# Patient Record
Sex: Female | Born: 1950 | Race: White | Hispanic: No | Marital: Married | Smoking: Never smoker
Health system: Southern US, Community
[De-identification: ages and names within clinical notes are randomized; demographics above are authoritative.]

## PROBLEM LIST (undated history)

## (undated) DIAGNOSIS — I10 Essential (primary) hypertension: Secondary | ICD-10-CM

## (undated) DIAGNOSIS — F32A Depression, unspecified: Secondary | ICD-10-CM

## (undated) DIAGNOSIS — F329 Major depressive disorder, single episode, unspecified: Secondary | ICD-10-CM

## (undated) DIAGNOSIS — E039 Hypothyroidism, unspecified: Secondary | ICD-10-CM

## (undated) DIAGNOSIS — F4310 Post-traumatic stress disorder, unspecified: Secondary | ICD-10-CM

## (undated) DIAGNOSIS — M1990 Unspecified osteoarthritis, unspecified site: Secondary | ICD-10-CM

## (undated) DIAGNOSIS — T4145XA Adverse effect of unspecified anesthetic, initial encounter: Secondary | ICD-10-CM

## (undated) DIAGNOSIS — E079 Disorder of thyroid, unspecified: Secondary | ICD-10-CM

## (undated) DIAGNOSIS — C189 Malignant neoplasm of colon, unspecified: Secondary | ICD-10-CM

## (undated) DIAGNOSIS — H9192 Unspecified hearing loss, left ear: Secondary | ICD-10-CM

## (undated) DIAGNOSIS — D649 Anemia, unspecified: Secondary | ICD-10-CM

## (undated) DIAGNOSIS — T8859XA Other complications of anesthesia, initial encounter: Secondary | ICD-10-CM

## (undated) DIAGNOSIS — Z9889 Other specified postprocedural states: Secondary | ICD-10-CM

## (undated) DIAGNOSIS — E7800 Pure hypercholesterolemia, unspecified: Secondary | ICD-10-CM

## (undated) DIAGNOSIS — L719 Rosacea, unspecified: Secondary | ICD-10-CM

## (undated) DIAGNOSIS — R112 Nausea with vomiting, unspecified: Secondary | ICD-10-CM

## (undated) DIAGNOSIS — J45909 Unspecified asthma, uncomplicated: Secondary | ICD-10-CM

## (undated) DIAGNOSIS — J302 Other seasonal allergic rhinitis: Secondary | ICD-10-CM

## (undated) HISTORY — PX: DILATION AND CURETTAGE OF UTERUS: SHX78

## (undated) HISTORY — PX: COLONOSCOPY: SHX174

## (undated) HISTORY — PX: FRACTURE SURGERY: SHX138

---

## 1973-11-10 HISTORY — PX: APPENDECTOMY: SHX54

## 1976-11-10 HISTORY — PX: TUBAL LIGATION: SHX77

## 2008-11-10 HISTORY — PX: BRAIN SURGERY: SHX531

## 2013-07-28 ENCOUNTER — Inpatient Hospital Stay (HOSPITAL_COMMUNITY)
Admission: EM | Admit: 2013-07-28 | Discharge: 2013-07-30 | DRG: 219 | Disposition: A | Discharge status: Home / Self Care | Payer: Federal, State, Local not specified - PPO | Attending: Orthopedic Surgery | Admitting: Orthopedic Surgery

## 2013-07-28 DIAGNOSIS — S82843B Displaced bimalleolar fracture of unspecified lower leg, initial encounter for open fracture type I or II: Principal | ICD-10-CM | POA: Diagnosis present

## 2013-07-28 DIAGNOSIS — Y92009 Unspecified place in unspecified non-institutional (private) residence as the place of occurrence of the external cause: Secondary | ICD-10-CM

## 2013-07-28 DIAGNOSIS — F329 Major depressive disorder, single episode, unspecified: Secondary | ICD-10-CM | POA: Diagnosis present

## 2013-07-28 DIAGNOSIS — I10 Essential (primary) hypertension: Secondary | ICD-10-CM | POA: Diagnosis present

## 2013-07-28 DIAGNOSIS — F3289 Other specified depressive episodes: Secondary | ICD-10-CM | POA: Diagnosis present

## 2013-07-28 DIAGNOSIS — Z79899 Other long term (current) drug therapy: Secondary | ICD-10-CM

## 2013-07-28 DIAGNOSIS — Z7982 Long term (current) use of aspirin: Secondary | ICD-10-CM

## 2013-07-28 DIAGNOSIS — W19XXXA Unspecified fall, initial encounter: Secondary | ICD-10-CM | POA: Diagnosis present

## 2013-07-28 DIAGNOSIS — S82899B Other fracture of unspecified lower leg, initial encounter for open fracture type I or II: Secondary | ICD-10-CM

## 2013-07-28 DIAGNOSIS — F4310 Post-traumatic stress disorder, unspecified: Secondary | ICD-10-CM | POA: Diagnosis present

## 2013-07-28 HISTORY — DX: Essential (primary) hypertension: I10

## 2013-07-28 HISTORY — DX: Major depressive disorder, single episode, unspecified: F32.9

## 2013-07-28 HISTORY — DX: Depression, unspecified: F32.A

## 2013-07-28 HISTORY — DX: Post-traumatic stress disorder, unspecified: F43.10

## 2013-07-28 HISTORY — DX: Disorder of thyroid, unspecified: E07.9

## 2013-07-28 HISTORY — DX: Unspecified osteoarthritis, unspecified site: M19.90

## 2013-07-29 ENCOUNTER — Encounter (HOSPITAL_COMMUNITY)
Admission: EM | Disposition: A | Discharge status: Home / Self Care | Payer: Self-pay | Source: Home / Self Care | Attending: Orthopedic Surgery

## 2013-07-29 ENCOUNTER — Encounter (HOSPITAL_COMMUNITY): Payer: Self-pay | Admitting: Certified Registered Nurse Anesthetist

## 2013-07-29 ENCOUNTER — Encounter (HOSPITAL_COMMUNITY): Payer: Self-pay

## 2013-07-29 ENCOUNTER — Emergency Department (HOSPITAL_COMMUNITY): Payer: Federal, State, Local not specified - PPO

## 2013-07-29 ENCOUNTER — Emergency Department (HOSPITAL_COMMUNITY): Payer: Federal, State, Local not specified - PPO | Admitting: Certified Registered Nurse Anesthetist

## 2013-07-29 HISTORY — PX: ORIF ANKLE FRACTURE: SHX5408

## 2013-07-29 HISTORY — PX: I & D EXTREMITY: SHX5045

## 2013-07-29 LAB — POCT I-STAT, CHEM 8
BUN: 16 mg/dL (ref 6–23)
Creatinine, Ser: 1.3 mg/dL — ABNORMAL HIGH (ref 0.50–1.10)
Glucose, Bld: 119 mg/dL — ABNORMAL HIGH (ref 70–99)
Hemoglobin: 13.9 g/dL (ref 12.0–15.0)
Sodium: 131 mEq/L — ABNORMAL LOW (ref 135–145)
TCO2: 23 mmol/L (ref 0–100)

## 2013-07-29 LAB — CBC
HCT: 37.2 % (ref 36.0–46.0)
HCT: 35.6 % — ABNORMAL LOW (ref 36.0–46.0)
Hemoglobin: 13.5 g/dL (ref 12.0–15.0)
MCH: 35.1 pg — ABNORMAL HIGH (ref 26.0–34.0)
MCHC: 36.3 g/dL — ABNORMAL HIGH (ref 30.0–36.0)
MCHC: 35.1 g/dL (ref 30.0–36.0)
MCV: 96.6 fL (ref 78.0–100.0)
MCV: 98.3 fL (ref 78.0–100.0)
RDW: 13.6 % (ref 11.5–15.5)
RDW: 13.8 % (ref 11.5–15.5)
WBC: 7.3 10*3/uL (ref 4.0–10.5)

## 2013-07-29 SURGERY — OPEN REDUCTION INTERNAL FIXATION (ORIF) ANKLE FRACTURE
Anesthesia: General | Site: Ankle | Laterality: Left | Wound class: Contaminated

## 2013-07-29 MED ORDER — LACTATED RINGERS IV SOLN
INTRAVENOUS | Status: DC | PRN
  Administered 2013-07-29: 03:00:00 via INTRAVENOUS

## 2013-07-29 MED ORDER — MORPHINE SULFATE 2 MG/ML IJ SOLN: 1.0000 mg | INTRAMUSCULAR | Status: DC | PRN

## 2013-07-29 MED ORDER — FLEET ENEMA 7-19 GM/118ML RE ENEM: 1.0000 | ENEMA | Freq: Once | RECTAL | Status: AC | PRN

## 2013-07-29 MED ORDER — ONDANSETRON HCL 4 MG/2ML IJ SOLN
4.0000 mg | Freq: Once | INTRAMUSCULAR | Status: AC
  Administered 2013-07-29: 4 mg via INTRAVENOUS
  Filled 2013-07-29: qty 2

## 2013-07-29 MED ORDER — DIPHENHYDRAMINE HCL 12.5 MG/5ML PO ELIX: 12.5000 mg | ORAL_SOLUTION | ORAL | Status: DC | PRN

## 2013-07-29 MED ORDER — OXYCODONE-ACETAMINOPHEN 5-325 MG PO TABS: 1.0000 | ORAL_TABLET | ORAL | Status: DC | PRN

## 2013-07-29 MED ORDER — POLYETHYLENE GLYCOL 3350 17 G PO PACK: 17.0000 g | PACK | Freq: Every day | ORAL | Status: DC | PRN

## 2013-07-29 MED ORDER — HYDROMORPHONE HCL PF 1 MG/ML IJ SOLN
1.0000 mg | Freq: Once | INTRAMUSCULAR | Status: AC
  Administered 2013-07-29: 1 mg via INTRAVENOUS
  Filled 2013-07-29: qty 1

## 2013-07-29 MED ORDER — ASPIRIN EC 325 MG PO TBEC
325.0000 mg | DELAYED_RELEASE_TABLET | Freq: Two times a day (BID) | ORAL | Status: DC
  Administered 2013-07-29 – 2013-07-30 (×3): 325 mg via ORAL
  Filled 2013-07-29 (×5): qty 1

## 2013-07-29 MED ORDER — FENTANYL CITRATE 0.05 MG/ML IJ SOLN
50.0000 ug | INTRAMUSCULAR | Status: DC | PRN
  Administered 2013-07-29: 50 ug via INTRAVENOUS
  Filled 2013-07-29: qty 2

## 2013-07-29 MED ORDER — DEXAMETHASONE SODIUM PHOSPHATE 4 MG/ML IJ SOLN
INTRAMUSCULAR | Status: DC | PRN
  Administered 2013-07-29: 4 mg via INTRAVENOUS

## 2013-07-29 MED ORDER — HYDROMORPHONE HCL PF 1 MG/ML IJ SOLN
INTRAMUSCULAR | Status: AC
  Filled 2013-07-29: qty 1

## 2013-07-29 MED ORDER — ARTIFICIAL TEARS OP OINT
TOPICAL_OINTMENT | OPHTHALMIC | Status: DC | PRN
  Administered 2013-07-29: 1 via OPHTHALMIC

## 2013-07-29 MED ORDER — LIDOCAINE HCL (CARDIAC) 20 MG/ML IV SOLN
INTRAVENOUS | Status: DC | PRN
  Administered 2013-07-29: 40 mg via INTRAVENOUS

## 2013-07-29 MED ORDER — PROPOFOL 10 MG/ML IV BOLUS
INTRAVENOUS | Status: DC | PRN
  Administered 2013-07-29: 140 mg via INTRAVENOUS

## 2013-07-29 MED ORDER — OXYCODONE HCL 5 MG PO TABS: 5.0000 mg | ORAL_TABLET | Freq: Once | ORAL | Status: DC | PRN

## 2013-07-29 MED ORDER — METOCLOPRAMIDE HCL 10 MG PO TABS: 5.0000 mg | ORAL_TABLET | Freq: Three times a day (TID) | ORAL | Status: DC | PRN

## 2013-07-29 MED ORDER — DOCUSATE SODIUM 100 MG PO CAPS
100.0000 mg | ORAL_CAPSULE | Freq: Two times a day (BID) | ORAL | Status: DC
  Administered 2013-07-29 – 2013-07-30 (×3): 100 mg via ORAL
  Filled 2013-07-29 (×3): qty 1

## 2013-07-29 MED ORDER — SUCCINYLCHOLINE CHLORIDE 20 MG/ML IJ SOLN
INTRAMUSCULAR | Status: DC | PRN
  Administered 2013-07-29: 100 mg via INTRAVENOUS

## 2013-07-29 MED ORDER — SODIUM CHLORIDE 0.9 % IV SOLN
INTRAVENOUS | Status: DC
  Administered 2013-07-29: 01:00:00 via INTRAVENOUS

## 2013-07-29 MED ORDER — HYDROCODONE-ACETAMINOPHEN 5-325 MG PO TABS: 1.0000 | ORAL_TABLET | ORAL | Status: DC | PRN

## 2013-07-29 MED ORDER — OXYCODONE HCL 5 MG/5ML PO SOLN: 5.0000 mg | Freq: Once | ORAL | Status: DC | PRN

## 2013-07-29 MED ORDER — FERROUS SULFATE 325 (65 FE) MG PO TABS
325.0000 mg | ORAL_TABLET | Freq: Three times a day (TID) | ORAL | Status: DC
  Administered 2013-07-29 – 2013-07-30 (×4): 325 mg via ORAL
  Filled 2013-07-29 (×7): qty 1

## 2013-07-29 MED ORDER — EPHEDRINE SULFATE 50 MG/ML IJ SOLN
INTRAMUSCULAR | Status: DC | PRN
  Administered 2013-07-29 (×2): 5 mg via INTRAVENOUS
  Administered 2013-07-29 (×3): 10 mg via INTRAVENOUS

## 2013-07-29 MED ORDER — ONDANSETRON HCL 4 MG PO TABS: 4.0000 mg | ORAL_TABLET | Freq: Four times a day (QID) | ORAL | Status: DC | PRN

## 2013-07-29 MED ORDER — MIDAZOLAM HCL 5 MG/5ML IJ SOLN
INTRAMUSCULAR | Status: DC | PRN
  Administered 2013-07-29: 2 mg via INTRAVENOUS

## 2013-07-29 MED ORDER — OXYCODONE-ACETAMINOPHEN 5-325 MG PO TABS
1.0000 | ORAL_TABLET | ORAL | Status: DC | PRN
  Administered 2013-07-29 (×3): 2 via ORAL
  Filled 2013-07-29 (×3): qty 2

## 2013-07-29 MED ORDER — CLINDAMYCIN PHOSPHATE 600 MG/50ML IV SOLN
600.0000 mg | Freq: Four times a day (QID) | INTRAVENOUS | Status: DC
  Administered 2013-07-29 – 2013-07-30 (×6): 600 mg via INTRAVENOUS
  Filled 2013-07-29 (×8): qty 50

## 2013-07-29 MED ORDER — FENTANYL CITRATE 0.05 MG/ML IJ SOLN
INTRAMUSCULAR | Status: DC | PRN
  Administered 2013-07-29: 100 ug via INTRAVENOUS
  Administered 2013-07-29 (×3): 50 ug via INTRAVENOUS

## 2013-07-29 MED ORDER — ZOLPIDEM TARTRATE 5 MG PO TABS: 5.0000 mg | ORAL_TABLET | Freq: Every evening | ORAL | Status: DC | PRN

## 2013-07-29 MED ORDER — METOCLOPRAMIDE HCL 5 MG/ML IJ SOLN: 5.0000 mg | Freq: Three times a day (TID) | INTRAMUSCULAR | Status: DC | PRN

## 2013-07-29 MED ORDER — OXYCODONE HCL 5 MG PO TABS
5.0000 mg | ORAL_TABLET | ORAL | Status: DC | PRN
  Administered 2013-07-30 (×2): 10 mg via ORAL
  Filled 2013-07-29 (×2): qty 2

## 2013-07-29 MED ORDER — VANCOMYCIN HCL IN DEXTROSE 1-5 GM/200ML-% IV SOLN
1000.0000 mg | Freq: Once | INTRAVENOUS | Status: AC
  Administered 2013-07-29: 1000 mg via INTRAVENOUS
  Filled 2013-07-29: qty 200

## 2013-07-29 MED ORDER — PHENYLEPHRINE HCL 10 MG/ML IJ SOLN
INTRAMUSCULAR | Status: DC | PRN
  Administered 2013-07-29 (×3): 40 ug via INTRAVENOUS

## 2013-07-29 MED ORDER — INFLUENZA VAC SPLIT QUAD 0.5 ML IM SUSP
0.5000 mL | INTRAMUSCULAR | Status: AC
  Administered 2013-07-30: 0.5 mL via INTRAMUSCULAR
  Filled 2013-07-29: qty 0.5

## 2013-07-29 MED ORDER — HYDROMORPHONE HCL PF 1 MG/ML IJ SOLN
0.2500 mg | INTRAMUSCULAR | Status: DC | PRN
  Administered 2013-07-29 (×2): 0.5 mg via INTRAVENOUS

## 2013-07-29 MED ORDER — TETANUS-DIPHTH-ACELL PERTUSSIS 5-2.5-18.5 LF-MCG/0.5 IM SUSP
0.5000 mL | Freq: Once | INTRAMUSCULAR | Status: DC
  Filled 2013-07-29: qty 0.5

## 2013-07-29 MED ORDER — SODIUM CHLORIDE 0.9 % IV SOLN: INTRAVENOUS | Status: DC

## 2013-07-29 MED ORDER — ONDANSETRON HCL 4 MG/2ML IJ SOLN
4.0000 mg | Freq: Four times a day (QID) | INTRAMUSCULAR | Status: DC | PRN
  Administered 2013-07-29: 4 mg via INTRAVENOUS
  Filled 2013-07-29: qty 2

## 2013-07-29 MED ORDER — BISACODYL 10 MG RE SUPP: 10.0000 mg | Freq: Every day | RECTAL | Status: DC | PRN

## 2013-07-29 SURGICAL SUPPLY — 83 items
BANDAGE ELASTIC 4 VELCRO ST LF (GAUZE/BANDAGES/DRESSINGS) ×2 IMPLANT
BANDAGE ELASTIC 6 VELCRO ST LF (GAUZE/BANDAGES/DRESSINGS) ×2 IMPLANT
BANDAGE ESMARK 6X9 LF (GAUZE/BANDAGES/DRESSINGS) ×1 IMPLANT
BANDAGE GAUZE ELAST BULKY 4 IN (GAUZE/BANDAGES/DRESSINGS) ×2 IMPLANT
BIT DRILL 2.5X110 QC LCP DISP (BIT) ×2 IMPLANT
BIT DRILL CANN 2.7X625 NONSTRL (BIT) ×2 IMPLANT
BLADE SURG 10 STRL SS (BLADE) ×2 IMPLANT
BLADE SURG ROTATE 9660 (MISCELLANEOUS) IMPLANT
BNDG COHESIVE 4X5 TAN STRL (GAUZE/BANDAGES/DRESSINGS) ×2 IMPLANT
BNDG ELASTIC 6X15 VLCR STRL LF (GAUZE/BANDAGES/DRESSINGS) ×2 IMPLANT
BNDG ESMARK 6X9 LF (GAUZE/BANDAGES/DRESSINGS) ×2
CHLORAPREP W/TINT 26ML (MISCELLANEOUS) ×2 IMPLANT
CLOTH BEACON ORANGE TIMEOUT ST (SAFETY) ×2 IMPLANT
COUNTERSINK F/3.5/4 CANN SCRW (INSTRUMENTS) ×4
COVER MAYO STAND STRL (DRAPES) ×2 IMPLANT
COVER SURGICAL LIGHT HANDLE (MISCELLANEOUS) ×2 IMPLANT
CUFF TOURNIQUET SINGLE 34IN LL (TOURNIQUET CUFF) ×2 IMPLANT
CUFF TOURNIQUET SINGLE 44IN (TOURNIQUET CUFF) IMPLANT
DRAPE OEC MINIVIEW 54X84 (DRAPES) IMPLANT
DRAPE SURG 17X23 STRL (DRAPES) ×2 IMPLANT
DRAPE U-SHAPE 47X51 STRL (DRAPES) ×2 IMPLANT
DRSG ADAPTIC 3X8 NADH LF (GAUZE/BANDAGES/DRESSINGS) ×2 IMPLANT
DRSG PAD ABDOMINAL 8X10 ST (GAUZE/BANDAGES/DRESSINGS) ×4 IMPLANT
DURAPREP 26ML APPLICATOR (WOUND CARE) ×2 IMPLANT
ELECT CAUTERY BLADE 6.4 (BLADE) ×2 IMPLANT
ELECT REM PT RETURN 9FT ADLT (ELECTROSURGICAL) ×2
ELECTRODE REM PT RTRN 9FT ADLT (ELECTROSURGICAL) ×1 IMPLANT
GAUZE XEROFORM 1X8 LF (GAUZE/BANDAGES/DRESSINGS) ×4 IMPLANT
GLOVE BIO SURGEON STRL SZ7 (GLOVE) ×2 IMPLANT
GLOVE BIO SURGEON STRL SZ7.5 (GLOVE) ×10 IMPLANT
GLOVE BIOGEL PI IND STRL 7.5 (GLOVE) ×1 IMPLANT
GLOVE BIOGEL PI IND STRL 8 (GLOVE) ×1 IMPLANT
GLOVE BIOGEL PI INDICATOR 7.5 (GLOVE) ×1
GLOVE BIOGEL PI INDICATOR 8 (GLOVE) ×1
GLOVE SURG SS PI 7.5 STRL IVOR (GLOVE) ×2 IMPLANT
GOWN PREVENTION PLUS LG XLONG (DISPOSABLE) ×4 IMPLANT
GOWN PREVENTION PLUS XLARGE (GOWN DISPOSABLE) ×2 IMPLANT
GOWN STRL NON-REIN LRG LVL3 (GOWN DISPOSABLE) ×6 IMPLANT
HANDPIECE INTERPULSE COAX TIP (DISPOSABLE)
IMMOBILIZER KNEE 24 THIGH 36 (MISCELLANEOUS) IMPLANT
IMMOBILIZER KNEE 24 UNIV (MISCELLANEOUS)
KIT BASIN OR (CUSTOM PROCEDURE TRAY) ×2 IMPLANT
KIT ROOM TURNOVER OR (KITS) ×2 IMPLANT
MANIFOLD NEPTUNE II (INSTRUMENTS) IMPLANT
NEEDLE 22X1 1/2 (OR ONLY) (NEEDLE) IMPLANT
NS IRRIG 1000ML POUR BTL (IV SOLUTION) ×2 IMPLANT
PACK ORTHO EXTREMITY (CUSTOM PROCEDURE TRAY) ×2 IMPLANT
PAD ARMBOARD 7.5X6 YLW CONV (MISCELLANEOUS) ×4 IMPLANT
PAD CAST 4YDX4 CTTN HI CHSV (CAST SUPPLIES) ×1 IMPLANT
PADDING CAST COTTON 4X4 STRL (CAST SUPPLIES) ×1
PLATE LCP 3.5 1/3 TUB 6HX69 (Plate) ×2 IMPLANT
SCREW CANC FT 4.0X20 (Screw) ×2 IMPLANT
SCREW CNTRSNK F/3.5/4 CAN SCRW (INSTRUMENTS) ×2 IMPLANT
SCREW CORTEX 3.5 12MM (Screw) ×1 IMPLANT
SCREW CORTEX 3.5 14MM (Screw) ×2 IMPLANT
SCREW CORTEX 3.5 20MM (Screw) ×1 IMPLANT
SCREW CORTEX 3.5 22MM (Screw) ×1 IMPLANT
SCREW LOCK CORT ST 3.5X12 (Screw) ×1 IMPLANT
SCREW LOCK CORT ST 3.5X14 (Screw) ×2 IMPLANT
SCREW LOCK CORT ST 3.5X20 (Screw) ×1 IMPLANT
SCREW LOCK CORT ST 3.5X22 (Screw) ×1 IMPLANT
SCREW SHORT THREAD 4.0X40 (Screw) ×4 IMPLANT
SET HNDPC FAN SPRY TIP SCT (DISPOSABLE) IMPLANT
SPLINT PLASTER CAST XFAST 4X15 (CAST SUPPLIES) ×1 IMPLANT
SPLINT PLASTER XTRA FAST SET 4 (CAST SUPPLIES) ×1
SPONGE GAUZE 4X4 12PLY (GAUZE/BANDAGES/DRESSINGS) ×2 IMPLANT
SPONGE LAP 18X18 X RAY DECT (DISPOSABLE) ×2 IMPLANT
SPONGE LAP 4X18 X RAY DECT (DISPOSABLE) ×4 IMPLANT
STAPLER VISISTAT 35W (STAPLE) ×2 IMPLANT
STOCKINETTE IMPERVIOUS 9X36 MD (GAUZE/BANDAGES/DRESSINGS) ×2 IMPLANT
SUCTION FRAZIER TIP 10 FR DISP (SUCTIONS) ×2 IMPLANT
SUT ETHILON 3 0 PS 1 (SUTURE) ×8 IMPLANT
SUT ETHILON 4 0 PS 2 18 (SUTURE) IMPLANT
SUT VIC AB 0 CTB1 27 (SUTURE) IMPLANT
SUT VIC AB 2-0 FS1 27 (SUTURE) ×4 IMPLANT
SYR CONTROL 10ML LL (SYRINGE) IMPLANT
TOWEL OR 17X24 6PK STRL BLUE (TOWEL DISPOSABLE) ×2 IMPLANT
TOWEL OR 17X26 10 PK STRL BLUE (TOWEL DISPOSABLE) ×2 IMPLANT
TUBE ANAEROBIC SPECIMEN COL (MISCELLANEOUS) IMPLANT
TUBE CONNECTING 12X1/4 (SUCTIONS) ×2 IMPLANT
TUBING CYSTO DISP (UROLOGICAL SUPPLIES) ×4 IMPLANT
WATER STERILE IRR 1000ML POUR (IV SOLUTION) IMPLANT
YANKAUER SUCT BULB TIP NO VENT (SUCTIONS) IMPLANT

## 2013-07-29 NOTE — ED Provider Notes (Signed)
CSN: [id number]     Arrival date & time 07/28/13  2359 History   First MD Initiated Contact with Patient 07/29/13 0002     Chief Complaint  Patient presents with  . Ankle Pain  . Leg Injury   (Consider location/radiation/quality/duration/timing/severity/associated sxs/prior Treatment) HPI HX per PT - at home, felt like her leg was cramping and stepped on it wrong, fell and sustained left ankle injury with bleeding and deformity.  She denies any other pain or trauma. She was able to crawl to her phone and called EMS. She admits to ETOH use earlier. No numbness or tingling c/o sharp severe ankle pain. She is not sure how she got an abrasion over her nose/ denies any facial pain or trauma. Has some dried blood bridge of nose.    Past Medical History  Diagnosis Date  . Thyroid disease   . Hypertension   . Arthritis   . Depression   . PTSD (post-traumatic stress disorder)    No past surgical history on file. No family history on file. History  Substance Use Topics  . Smoking status: Not on file  . Smokeless tobacco: Not on file  . Alcohol Use: Yes   OB History   Grav Para Term Preterm Abortions TAB SAB Ect Mult Living                 Review of Systems  Constitutional: Negative for fever and chills.  HENT: Negative for neck pain and neck stiffness.   Eyes: Negative for visual disturbance.  Respiratory: Negative for shortness of breath.   Cardiovascular: Negative for chest pain.  Gastrointestinal: Negative for abdominal pain.  Genitourinary: Negative for flank pain.  Musculoskeletal: Negative for back pain.  Skin: Positive for wound.  Neurological: Negative for syncope, speech difficulty, weakness and numbness.  All other systems reviewed and are negative.    Allergies  Review of patient's allergies indicates not on file.  Home Medications  No current outpatient prescriptions on file. BP 113/66  Temp(Src) 98.2 F (36.8 C) (Oral)  Resp 14  SpO2 98% Physical Exam   Nursing note and vitals reviewed. Constitutional: She is oriented to person, place, and time. She appears well-developed and well-nourished.  HENT:  Head: Normocephalic and atraumatic.  Eyes: EOM are normal. Pupils are equal, round, and reactive to light.  Neck: Neck supple.  Cardiovascular: Normal rate, normal heart sounds and intact distal pulses.   Pulmonary/Chest: Effort normal and breath sounds normal. No respiratory distress. She exhibits no tenderness.  Abdominal: Soft. She exhibits no distension. There is no tenderness.  Musculoskeletal:  LLE: Open fracture with deformity, bony exposure medial aspect of ankle with crepitus. dpp intact. Distal cap refill intact, able to mover toes and sensorium to light touch distally intact. Otherwise MAE x 4, no tenderness proximal fibula/ knee or hip. Pelvis stable  Neurological: She is alert and oriented to person, place, and time.  Skin: Skin is warm and dry.    ED Course  Reduction of dislocation Date/Time: 07/29/2013 1:22 AM Performed by: Sunnie Nielsen Authorized by: Sunnie Nielsen Consent: Verbal consent obtained. Risks and benefits: risks, benefits and alternatives were discussed Consent given by: patient Patient understanding: patient states understanding of the procedure being performed Patient consent: the patient's understanding of the procedure matches consent given Procedure consent: procedure consent matches procedure scheduled Required items: required blood products, implants, devices, and special equipment available Patient identity confirmed: verbally with patient Time out: Immediately prior to procedure a "time out" was called  to verify the correct patient, procedure, equipment, support staff and site/side marked as required. Preparation: Patient was prepped and draped in the usual sterile fashion. Patient tolerance: Patient tolerated the procedure well with no immediate complications. Comments: IV Dilaudid and reduction as  above   (including critical care time) Labs Review Labs Reviewed  CBC - Abnormal; Notable for the following:    RBC 3.85 (*)    MCH 35.1 (*)    MCHC 36.3 (*)    All other components within normal limits  ETHANOL - Abnormal; Notable for the following:    Alcohol, Ethyl (B) 175 (*)    All other components within normal limits  POCT I-STAT, CHEM 8 - Abnormal; Notable for the following:    Sodium 131 (*)    Creatinine, Ser 1.30 (*)    Glucose, Bld 119 (*)    Calcium, Ion 1.12 (*)    All other components within normal limits   Imaging Review Ct Head Wo Contrast  07/29/2013   *RADIOLOGY REPORT*  Clinical Data:  Fall next field none available  CT HEAD WITHOUT CONTRAST CT CERVICAL SPINE WITHOUT CONTRAST  Technique:  Multidetector CT imaging of the head and cervical spine was performed following the standard protocol without intravenous contrast.  Multiplanar CT image reconstructions of the cervical spine were also generated.  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  There is minimal mass effect on the adjacent pons.  The fourth ventricles widely patent. No significant edema is seen within the adjacent brain parenchyma. No extension into the temporal bone is appreciated, although poorly evaluated this examination.  Postoperative changes from prior cranioplasty are seen at the left occiput.  There is no acute intracranial hemorrhage or infarct. Gray-white matter differentiation is preserved.  No midline shift.  No extra- axial fluid collection.  Orbits are normal.  Paranasal sinuses and mastoid air cells are clear.  IMPRESSION:  1.  No acute intracranial process. 2. Postoperative changes from prior left occipital craniotomy with cranioplasty with calcified left cerebellopontine angle mass. Correlation with the history and prior studies is recommended.  CT CERVICAL SPINE  Findings: There is no acute  fracture or listhesis identified within the cervical spine. To body heights are preserved.  Normal C1-2 articulations are intact.  Facet joints are aligned.  Reversal of the normal cervical lordosis is present with apex at the C5-6 level.  Prominent left-sided facet arthrosis is present at C2., C3- 4.  Degenerative intervertebral disc space narrowing with endplate osteophytosis present at C5-6.  IMPRESSION: 1.  No evidence of acute fracture or listhesis within the cervical spine. 2.  Degenerative disc disease and facet arthrosis as above, most severe at C5-6   Original Report Authenticated By: Rise Mu, M.D.   Ct Cervical Spine Wo Contrast  07/29/2013   *RADIOLOGY REPORT*  Clinical Data:  Fall next field none available  CT HEAD WITHOUT CONTRAST CT CERVICAL SPINE WITHOUT CONTRAST  Technique:  Multidetector CT imaging of the head and cervical spine was performed following the standard protocol without intravenous contrast.  Multiplanar CT image reconstructions of the cervical spine were also generated.  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  There is minimal mass effect on the adjacent pons.  The fourth ventricles widely patent. No significant edema is seen  within the adjacent brain parenchyma. No extension into the temporal bone is appreciated, although poorly evaluated this examination.  Postoperative changes from prior cranioplasty are seen at the left occiput.  There is no acute intracranial hemorrhage or infarct. Gray-white matter differentiation is preserved.  No midline shift.  No extra- axial fluid collection.  Orbits are normal.  Paranasal sinuses and mastoid air cells are clear.  IMPRESSION:  1.  No acute intracranial process. 2. Postoperative changes from prior left occipital craniotomy with cranioplasty with calcified left cerebellopontine angle mass. Correlation with the history and prior  studies is recommended.  CT CERVICAL SPINE  Findings: There is no acute fracture or listhesis identified within the cervical spine. To body heights are preserved.  Normal C1-2 articulations are intact.  Facet joints are aligned.  Reversal of the normal cervical lordosis is present with apex at the C5-6 level.  Prominent left-sided facet arthrosis is present at C2., C3- 4.  Degenerative intervertebral disc space narrowing with endplate osteophytosis present at C5-6.  IMPRESSION: 1.  No evidence of acute fracture or listhesis within the cervical spine. 2.  Degenerative disc disease and facet arthrosis as above, most severe at C5-6   Original Report Authenticated By: Rise Mu, M.D.   Dg Ankle Left Port  07/29/2013   CLINICAL DATA:  History of trauma from a fall.  EXAM: PORTABLE LEFT ANKLE - 2 VIEW  COMPARISON:  No priors.  FINDINGS: Two views of the left ankle demonstrate comminuted fractures of the distal tibia and fibula with complete dislocation at the tibiotalar joint. The tibial fracture appears to extend to the skin surface, suggesting an open wound. The fibular fracture is angulated approximately 45 degrees laterally. The talus is completely dislocated laterally relative to the distal tibia. Gas is present in the overlying soft tissues.  IMPRESSION: Severe open comminuted fracture-dislocation of the left ankle joint, as discussed above.   Electronically Signed   By: Trudie Reed M.D.   On: 07/29/2013 01:40    Date: 07/29/2013  Rate: 67  Rhythm: normal sinus rhythm  QRS Axis: normal  Intervals: normal  ST/T Wave abnormalities: nonspecific ST/T changes  Conduction Disutrbances:none  Narrative Interpretation:   Old EKG Reviewed: none available  IV ABx, IV narcotics  Ankle reduced with manual traction and splint placed, remains unstable. Distal pulses remained intact.  1:09 AM d/w Ortho Dr [address] Filter, plan OR MDM  Dx: Open L ankle fracture dislocation  IV ABx, narcotics and  NS Labs, imaging FX  Dislocation reduction, splinting and OR     Sunnie Nielsen, MD 07/29/13 [phone]

## 2013-07-29 NOTE — ED Notes (Signed)
Patient refused Tetanus.  Stated that she received tetanus in 2012

## 2013-07-29 NOTE — Progress Notes (Signed)
Orthopedic Tech Progress Note Patient Details:  Tonya Rodgers [birth year] [id number]  Ortho Devices Type of Ortho Device: Post (short) splint   [name] 07/29/2013, 12:36 AM

## 2013-07-29 NOTE — Preoperative (Signed)
Beta Blockers   Reason not to administer Beta Blockers:Not Applicable 

## 2013-07-29 NOTE — Anesthesia Procedure Notes (Signed)
Procedure Name: Intubation Date/Time: 07/29/2013 2:40 AM Performed by: Julianne Rice Z Pre-anesthesia Checklist: Patient identified, Timeout performed, Emergency Drugs available, Suction available and Patient being monitored Patient Re-evaluated:Patient Re-evaluated prior to inductionOxygen Delivery Method: Circle system utilized Preoxygenation: Pre-oxygenation with 100% oxygen Intubation Type: IV induction, Rapid sequence and Cricoid Pressure applied Laryngoscope Size: Mac and 3 Grade View: Grade II Tube type: Oral Number of attempts: 1 Airway Equipment and Method: Stylet Placement Confirmation: ETT inserted through vocal cords under direct vision,  breath sounds checked- equal and bilateral and positive ETCO2 Secured at: 22 cm Tube secured with: Tape Dental Injury: Teeth and Oropharynx as per pre-operative assessment

## 2013-07-29 NOTE — ED Notes (Signed)
Pt states she was home and her "ankle gave out".  Pt states that initially she got up and attempted to walk.  Pt taped up the ankle.  The ankle began to bleed.  Pt called EMS.  [facility] reports that upon their arrival they attempted to remove the tape.  Open fracture observed to left lower leg/ankle observed.  EMS reports bleeding started again after removing some tape.  They wrapped ankle and bleeding is controlled.  Pt is alert and oriented.  Pt states she's had 2 mixed drinks tonight. Pt denies falling, but has blood around her nose.

## 2013-07-29 NOTE — Evaluation (Signed)
Physical Therapy Evaluation Patient Details Name: Tonya Rodgers MRN: [id number] DOB: 1950-12-13 Today's Date: 07/29/2013 Time: 4098-1191 PT Time Calculation (min): 23 min  PT Assessment / Plan / Recommendation History of Present Illness  Fall at home resulting in Lt ankle dislocation; s/p ORIF Lt ankle and I&D Lt LE  Clinical Impression  Patient is s/p ORIF Lt ankle and I&D Lt ankle surgery resulting in functional limitations due to the deficits listed below (see PT Problem List).  Patient will benefit from skilled PT to increase their independence and safety with mobility to allow discharge to the venue listed below. Pt reports she has had multiple falls in last 6 mo and noticed a change in her balance since her last neuro surgery. Pt is unsteady and is a fall risk for transfers and amb. Pt is refusing home health or any rehab after this venue. Pt reports her husband will help her 24/7 or she will scoot on her "bottom" when she wants to go somewhere. Encouraged pt to have wheelchair for long distances. Pt will need to go up 2 STE house.    PT Assessment  Patient needs continued PT services    Follow Up Recommendations  Supervision/Assistance - 24 hour;Other (comment);Outpatient PT (pt stated she did not want any therapy at home )    Does the patient have the potential to tolerate intense rehabilitation      Barriers to Discharge        Equipment Recommendations  Rolling walker with 5" wheels;3in1 (PT);Other (comment) (pt stated she did not want wheelchair )    Recommendations for Other Services     Frequency Min 4X/week    Precautions / Restrictions Precautions Precautions: Fall Precaution Comments: pt reports she has had multiple falls at home and has had balance issues since her brain surgery in 2010 Restrictions Weight Bearing Restrictions: Yes LLE Weight Bearing: Non weight bearing   Pertinent Vitals/Pain 4/10; patient repositioned for comfort and pt premedicated      Mobility  Bed Mobility Bed Mobility: Supine to Sit;Sitting - Scoot to Edge of Bed Supine to Sit: 5: Supervision;HOB elevated;With rails Sitting - Scoot to Edge of Bed: 5: Supervision;With rail Details for Bed Mobility Assistance: supervision for cues and safety; HOB elevated and pt relied on handrails Transfers Transfers: Sit to Stand;Stand to Sit Sit to Stand: 4: Min guard;From bed Stand to Sit: 4: Min assist;To chair/3-in-1;With armrests;With upper extremity assist Details for Transfer Assistance: pt requires (A) to maintain balance; cues for hand placement and sequencing; pt unsafe to attempt transfers alone at this time; became tremorous but stated this has been occuring since last brain surgery in 2010 Ambulation/Gait Ambulation/Gait Assistance: 4: Min assist Ambulation Distance (Feet): 6 Feet Assistive device: Rolling walker Ambulation/Gait Assistance Details: cues for sequencing and safety with RW; pt unsteady with gt and requires (A) to manage RW and maintain balance Gait Pattern:  (hop to; NWB on Lt LE ) Gait velocity: decr Stairs: No Wheelchair Mobility Wheelchair Mobility: No         PT Diagnosis: Difficulty walking;Acute pain  PT Problem List: Decreased strength;Decreased range of motion;Decreased activity tolerance;Decreased balance;Decreased mobility;Decreased safety awareness;Decreased knowledge of use of DME;Pain PT Treatment Interventions: DME instruction;Gait training;Stair training;Functional mobility training;Therapeutic activities;Therapeutic exercise;Balance training;Neuromuscular re-education;Patient/family education;Wheelchair mobility training     PT Goals(Current goals can be found in the care plan section) Acute Rehab PT Goals Patient Stated Goal: to go home tomorrow PT Goal Formulation: With patient Time For Goal Achievement: 08/05/13 Potential to Achieve  Goals: Good  Visit Information  Last PT Received On: 07/29/13 Assistance Needed: +2 (for  steps ) History of Present Illness: Fall at home resulting in Lt ankle dislocation; s/p ORIF Lt ankle and I&D Lt LE       Prior Functioning  Home Living Family/patient expects to be discharged to:: Private residence Living Arrangements: Spouse/significant other Available Help at Discharge: Family;Available 24 hours/day Type of Home: House Home Access: Stairs to enter [employer] of Steps: 2 Entrance Stairs-Rails: Left Home Layout: One level Home Equipment: Shower seat - built in;Grab bars - tub/shower;Grab bars - toilet;Hand held shower head Additional Comments: pt has walk in shower and standard toilet seat height Prior Function Level of Independence: Independent Communication Communication: No difficulties Dominant Hand: Right    Cognition  Cognition Arousal/Alertness: Awake/alert Behavior During Therapy: WFL for tasks assessed/performed Overall Cognitive Status: Within Functional Limits for tasks assessed    Extremity/Trunk Assessment Upper Extremity Assessment Upper Extremity Assessment: Overall WFL for tasks assessed Lower Extremity Assessment Lower Extremity Assessment: LLE deficits/detail LLE Deficits / Details: unable to assess ankle LLE: Unable to fully assess due to pain;Unable to fully assess due to immobilization Cervical / Trunk Assessment Cervical / Trunk Assessment: Normal   Balance Balance Balance Assessed: Yes Static Standing Balance Static Standing - Balance Support: Bilateral upper extremity supported;During functional activity Static Standing - Level of Assistance: 4: Min assist Static Standing - Comment/# of Minutes: stood for pregt activities; pt requires (A) to maintain balance due to NWB status and fear of falling  End of Session PT - End of Session Equipment Utilized During Treatment: Gait belt Activity Tolerance: Patient tolerated treatment well Patient left: in chair;with call bell/phone within reach Nurse Communication: Mobility  status  GP     Donell Sievert, [place] [phone] 07/29/2013, 10:53 AM

## 2013-07-29 NOTE — Anesthesia Preprocedure Evaluation (Addendum)
Anesthesia Evaluation  Patient identified by MRN, date of birth, ID band Patient awake    Reviewed: Allergy & Precautions, H&P , NPO status , Patient's Chart, lab work & pertinent test results  Airway Mallampati: III TM Distance: >3 FB Neck ROM: Full    Dental no notable dental hx. (+) Teeth Intact and Dental Advisory Given   Pulmonary neg pulmonary ROS,  breath sounds clear to auscultation  Pulmonary exam normal       Cardiovascular negative cardio ROS  Rhythm:Regular Rate:Normal     Neuro/Psych Depression negative neurological ROS     GI/Hepatic negative GI ROS, Neg liver ROS,   Endo/Other  negative endocrine ROSHypothyroidism   Renal/GU negative Renal ROS  negative genitourinary   Musculoskeletal   Abdominal   Peds  Hematology negative hematology ROS (+)   Anesthesia Other Findings   Reproductive/Obstetrics negative OB ROS                         Anesthesia Physical Anesthesia Plan  ASA: II and emergent  Anesthesia Plan: General   Post-op Pain Management:    Induction: Intravenous, Rapid sequence and Cricoid pressure planned  Airway Management Planned: Oral ETT  Additional Equipment:   Intra-op Plan:   Post-operative Plan: Extubation in OR  Informed Consent: I have reviewed the patients History and Physical, chart, labs and discussed the procedure including the risks, benefits and alternatives for the proposed anesthesia with the patient or authorized representative who has indicated his/her understanding and acceptance.   Dental advisory given  Plan Discussed with: CRNA  Anesthesia Plan Comments:         Anesthesia Quick Evaluation

## 2013-07-29 NOTE — ED Notes (Signed)
Pt transported to OR by techs.

## 2013-07-29 NOTE — H&P (Addendum)
Tonya Rodgers is an 62 y.o. female.   Chief Complaint: L ankle open fracture dislocation HPI: Says leg gave out tonight.  C/o open wound, deformity, pain L ankle.  Denies other injury.  Had been drinking EtOH tonight.  Past Medical History  Diagnosis Date  . Thyroid disease   . Hypertension   . Arthritis   . Depression   . PTSD (post-traumatic stress disorder)     No past surgical history on file.  No family history on file. Social History:  reports that  drinks alcohol. Her tobacco and drug histories are not on file.  Allergies: Allergies not on file   (Not in a hospital admission)  Results for orders placed during the hospital encounter of 07/28/13 (from the past 48 hour(s))  CBC     Status: Abnormal   Collection Time    07/29/13 12:24 AM      Result Value Range   WBC 7.5  4.0 - 10.5 K/uL   RBC 3.85 (*) 3.87 - 5.11 MIL/uL   Hemoglobin 13.5  12.0 - 15.0 g/dL   HCT 16.1  09.6 - 04.5 %   MCV 96.6  78.0 - 100.0 fL   MCH 35.1 (*) 26.0 - 34.0 pg   MCHC 36.3 (*) 30.0 - 36.0 g/dL   RDW 40.9  81.1 - 91.4 %   Platelets 234  150 - 400 K/uL  ETHANOL     Status: Abnormal   Collection Time    07/29/13 12:24 AM      Result Value Range   Alcohol, Ethyl (B) 175 (*) 0 - 11 mg/dL   Comment:            LOWEST DETECTABLE LIMIT FOR     SERUM ALCOHOL IS 11 mg/dL     FOR MEDICAL PURPOSES ONLY  POCT I-STAT, CHEM 8     Status: Abnormal   Collection Time    07/29/13 12:54 AM      Result Value Range   Sodium 131 (*) 135 - 145 mEq/L   Potassium 3.6  3.5 - 5.1 mEq/L   Chloride 96  96 - 112 mEq/L   BUN 16  6 - 23 mg/dL   Creatinine, Ser 7.82 (*) 0.50 - 1.10 mg/dL   Glucose, Bld 956 (*) 70 - 99 mg/dL   Calcium, Ion 2.13 (*) 1.13 - 1.30 mmol/L   TCO2 23  0 - 100 mmol/L   Hemoglobin 13.9  12.0 - 15.0 g/dL   HCT 08.6  57.8 - 46.9 %   No results found.  Review of Systems  All other systems reviewed and are negative.    Blood pressure 113/66, temperature 98.2 F (36.8 C),  temperature source Oral, resp. rate 14, SpO2 98.00%. Physical Exam  Constitutional: She is oriented to person, place, and time. She appears well-developed and well-nourished.  Eyes: EOM are normal.  Respiratory: Effort normal.  Musculoskeletal:  L ankle with large transverse medial open wound with obvious fracture.   Lateral TTP, deformity.  Can wiggle toes up and down, NSTLT dorsal and plantar toes.  CR <2 sec. No other upper ext or RLE apparent injuries.  Neurological: She is alert and oriented to person, place, and time.  Psychiatric: She has a normal mood and affect.     Assessment/Plan L open ankle fracture/dislocation Reduced in ED by EDP abx in ED Will take urgently to OR for I&D and ORIF tonight NPO Will admit for IV abx x 48 hours.  Mable Paris 07/29/2013,  1:36 AM

## 2013-07-29 NOTE — ED Notes (Signed)
This RN went to OR to retrieve pt's belongings and this RN will inventory and turn over to security in the ED.

## 2013-07-29 NOTE — Progress Notes (Signed)
UR COMPLETED  

## 2013-07-29 NOTE — Progress Notes (Signed)
PATIENT ID: Tonya Rodgers   Day of Surgery Procedure(s) (LRB): OPEN REDUCTION INTERNAL FIXATION (ORIF) ANKLE FRACTURE (Left) IRRIGATION AND DEBRIDEMENT EXTREMITY (Left)  Subjective: reports feeling fairly well this morning, soreness in the left ankle but pain is well-controlled with pain medication.  No other complaints or concerns.  Objective:  Filed Vitals:   07/29/13 0519  BP: 113/61  Pulse: 78  Temp: 99 F (37.2 C)  Resp: 16     Awake alert and oriented Left lower extremity and splint that is clean dry and intact Can wiggle toes, distally NVI, adequate cap refill  Labs:   Recent Labs  07/29/13 0024 07/29/13 0054 07/29/13 0625  HGB 13.5 13.9 12.5   Recent Labs  07/29/13 0024 07/29/13 0054 07/29/13 0625  WBC 7.5  --  7.3  RBC 3.85*  --  3.62*  HCT 37.2 41.0 35.6*  PLT 234  --  204   Recent Labs  07/29/13 0054  NA 131*  K 3.6  CL 96  BUN 16  CREATININE 1.30*  GLUCOSE 119*    Assessment and Plan: Nonweightbearing left lower extremity Continue left lower extremity in splint Elevate as frequently as possible Will work with physical therapy today Clindamycin for 48 hours. D/c home tomorrow  VTE proph: aspirin 325 mg twice a day, SCD

## 2013-07-29 NOTE — Op Note (Signed)
Procedure(s): OPEN REDUCTION INTERNAL FIXATION (ORIF) ANKLE FRACTURE IRRIGATION AND DEBRIDEMENT EXTREMITY Procedure Note  Tonya Rodgers female 62 y.o. 07/29/2013  Procedure(s) and Anesthesia Type:    * OPEN REDUCTION INTERNAL FIXATION (ORIF) bimalleolar ANKLE FRACTURE - General    * IRRIGATION AND DEBRIDEMENT GRADE 2 OPEN MEDIAL MALLEOLUS FRACTURE  - General  Surgeon(s) and Role:    * Mable Paris, MD - Primary   Indications:  62 y.o. female s/p fall with left open ankle fracture dislocated. Indicated for surgery to promote anatomic restoration of joint, irrigate the joint and fractures and try and decrease risk of infection.     Surgeon: Mable Paris   Assistants:Danielle Lalibert PA-C (Danielle was present and scrubbed throughout the procedure and was essential in positioning, retraction, exposure, and closure)  Anesthesia: General endotracheal anesthesia    Procedure Detail  OPEN REDUCTION INTERNAL FIXATION (ORIF) ANKLE FRACTURE, IRRIGATION AND DEBRIDEMENT EXTREMITY  Findings: 6 cm open wound medially. When copiously irrigated fracture ends debrided. Fracture repaired with 2 cancellus cannulated screws medially and one 7 hole one third tubular plate laterally. Syndesmosis was intact. Bone quality was extremely poor. Lateral fracture was comminuted.  Estimated Blood Loss:  Minimal         Drains: none  Blood Given: none         Specimens: none        Complications:  * No complications entered in OR log *         Disposition: PACU - hemodynamically stable.         Condition: stable    Procedure:  The patient was identified in the preoperative  holding area after coming up in the emergency department where I personally marked the operative site after  verifying site side and procedure with the patient. She did receive 1 g IV vancomycin in the emergency department. She has an allergy to Keflex the The patient was taken back  to the operating  room where general anesthesia was induced without  Complication. The patient was placed in supine position with a bump under the operative hip. A non sterile tourniquet was applied to the thigh. The patient did receive IV antibiotics prior to the incision.   After the appropriate time-out, the limb was exsanguinated and the tourniquet was elevated to 350 mmHg.    Attention was first turned to the medial side where her open wound was. The wound was copiously irrigated with normal saline. The fracture ends were exposed and debrided and copiously irrigated. The joint was copiously irrigated.   The fracture was held anatomically reduced with a reduction forceps and two guidewires were passed across the fracture percutaneously from distally. Percutaneous stab incisions were felt to be less traumatic than extending the traumatic wound distally. The reduction and pin position was verified with fluoro and then 2 40 mm partially threaded cancellous screws were placed after over drilling the outer cortex.  Bone quality was noted to be very poor and the screws countersunk somewhat but fixation was still adequate.  A lateral incision was made over the fracture site and dissection was carried down the lateral fibula.  Bone quality was noted to be very poor. Made a significant attempt to try and minimize any significant soft tissue dissection at the fracture site. Fracture was grossly reduced in appropriate length and alignment and a 7 hole plate was placed laterally and fixed proximally and distally. Given her poor bone quality did not feel that trying to capture the butterfly fragment with  an interfragmentary screw would be beneficial and that the most beneficial thing would be to leave the biology intact and prevent stripping at the fracture site.  The syndesmosis was stressed and felt to be intact.  The wounds were then copiously irrigated and closed in layers with 2-0 vicryl in a deep layer and 3-0 nylon for skin  closure.  Sterile dressings were then applied and well padded well molded splint in a plantigrade position was applied.  The tourniquet was let down. The patient was then allowed to awaken from GA, taken to the PACU in stable condition.  POSTOPERATIVE PLAN: The patient will be non-weightbearing on the operative Extremity and will be kept in-house for 48 hours postoperatively for IV antibiotics to prevent infection in this open fracture.

## 2013-07-29 NOTE — Anesthesia Postprocedure Evaluation (Signed)
  Anesthesia Post-op Note  Patient: Tonya Rodgers  Procedure(s) Performed: Procedure(s): OPEN REDUCTION INTERNAL FIXATION (ORIF) ANKLE FRACTURE (Left) IRRIGATION AND DEBRIDEMENT EXTREMITY (Left)  Patient Location: PACU  Anesthesia Type:General  Level of Consciousness: awake and alert   Airway and Oxygen Therapy: Patient Spontanous Breathing and Patient connected to nasal cannula oxygen  Post-op Pain: mild  Post-op Assessment: Post-op Vital signs reviewed, Patient's Cardiovascular Status Stable, Respiratory Function Stable, Patent Airway and No signs of Nausea or vomiting  Post-op Vital Signs: Reviewed and stable  Complications: No apparent anesthesia complications

## 2013-07-29 NOTE — Transfer of Care (Signed)
Immediate Anesthesia Transfer of Care Note  Patient: Tonya Rodgers  Procedure(s) Performed: Procedure(s): OPEN REDUCTION INTERNAL FIXATION (ORIF) ANKLE FRACTURE (Left) IRRIGATION AND DEBRIDEMENT EXTREMITY (Left)  Patient Location: PACU  Anesthesia Type:General  Level of Consciousness: awake, alert  and oriented  Airway & Oxygen Therapy: Patient Spontanous Breathing and Patient connected to nasal cannula oxygen  Post-op Assessment: Report given to PACU RN and Post -op Vital signs reviewed and stable  Post vital signs: Reviewed and stable  Complications: No apparent anesthesia complications

## 2013-07-30 LAB — BASIC METABOLIC PANEL
BUN: 11 mg/dL (ref 6–23)
Chloride: 95 mEq/L — ABNORMAL LOW (ref 96–112)
Creatinine, Ser: 0.87 mg/dL (ref 0.50–1.10)
GFR calc Af Amer: 80 mL/min — ABNORMAL LOW (ref >90)

## 2013-07-30 MED ORDER — ASPIRIN EC 325 MG PO TBEC: 325.0000 mg | DELAYED_RELEASE_TABLET | Freq: Two times a day (BID) | ORAL | Status: DC

## 2013-07-30 NOTE — Progress Notes (Signed)
   CARE MANAGEMENT NOTE 07/30/2013  Patient:  Tonya Rodgers,Tonya Rodgers   Account Number:  [id number]  Date Initiated:  07/30/2013  Documentation initiated by:  [facility]  Subjective/Objective Assessment:   adm: left open ankle fracture     Action/Plan:   discgarge planning   Anticipated DC Date:  07/30/2013   Anticipated DC Plan:        DC Planning Services  CM consult      [facility] Choice  HOME HEALTH   Choice offered to / List presented to:  C-1 Patient        HH arranged  HH-2 PT      Status of service:  Completed, signed off Medicare Important Message given?   (If response is "NO", the following Medicare IM given date fields will be blank) Date Medicare IM given:   Date Additional Medicare IM given:    Discharge Disposition:  HOME W HOME HEALTH SERVICES  Per UR Regulation:    If discussed at Long Length of Stay Meetings, dates discussed:    Comments:  08/01/13 14:35 CM was called by RN bc pt was refusing HHPT bc she didn't want to wait for "anymore people".  CM called into room and pt stated to "just set it up" and she'll cancel it later if she doesn't want it." Pt going home with husband.  Pt stated [facility] is"fine." No DME as pt states she has a RW at home and is refusing a wheelchair.  [facility] referral faxed for HHPT.  No other CM needs were communicated. Freddy Jaksch, BSN, CM [phone].

## 2013-07-30 NOTE — Progress Notes (Signed)
PATIENT ID: Tonya Rodgers  MRN: [id number]  DOB/AGE:  Mar 03, 1951 / 62 y.o.  1 Day Post-Op Procedure(s) (LRB): OPEN REDUCTION INTERNAL FIXATION (ORIF) ANKLE FRACTURE (Left) IRRIGATION AND DEBRIDEMENT EXTREMITY (Left)    PROGRESS NOTE Subjective:   Patient is alert, oriented, no Nausea, no Vomiting, yes passing gas, no Bowel Movement. Taking PO well. Denies SOB, Chest or Calf Pain. Using Incentive Spirometer, PAS in place. Ambulate , non weightbearing with a walker, Patient reports pain as mild,     Objective: Vital signs in last 24 hours: Temp:  [98.3 F (36.8 C)-98.8 F (37.1 C)] 98.8 F (37.1 C) (09/20 0539) Pulse Rate:  [72-83] 72 (09/20 0539) Resp:  [18-19] 19 (09/20 0539) BP: (105-124)/(57-72) 105/57 mmHg (09/20 0539) SpO2:  [93 %-99 %] 98 % (09/20 0539) Weight:  [86.183 kg (190 lb)] 86.183 kg (190 lb) (09/19 1700)    Intake/Output from previous day: I/O last 3 completed shifts: In: 2060 [P.O.:360; I.V.:1600; IV Piggyback:100] Out: 20 [Blood:20]   Intake/Output this shift: Total I/O In: 480 [P.O.:480] Out: -    LABORATORY DATA:  Recent Labs  07/29/13 0024 07/29/13 0054 07/29/13 0625 07/30/13 0500  WBC 7.5  --  7.3  --   HGB 13.5 13.9 12.5  --   HCT 37.2 41.0 35.6*  --   PLT 234  --  204  --   NA  --  131*  --  130*  K  --  3.6  --  4.0  CL  --  96  --  95*  CO2  --   --   --  23  BUN  --  16  --  11  CREATININE  --  1.30*  --  0.87  GLUCOSE  --  119*  --  114*  CALCIUM  --   --   --  8.1*    Examination: Neurologically intact Neurovascular intact Sensation intact distally Compartment soft} Pt can wiggle toes with out difficulty Splint clean dry and intact Assessment:   1 Day Post-Op Procedure(s) (LRB): OPEN REDUCTION INTERNAL FIXATION (ORIF) ANKLE FRACTURE (Left) IRRIGATION AND DEBRIDEMENT EXTREMITY (Left) ADDITIONAL DIAGNOSIS:  Hypertension and throid disease  Plan:  Non Weight Bearing (NWB)  DVT Prophylaxis:  Aspirin 325 mg  bid  DISCHARGE  PLAN: Home  DISCHARGE NEEDS: Domenic Polite, Rhyleigh Grassel R 07/30/2013, 1:19 PM

## 2013-07-30 NOTE — Progress Notes (Signed)
PT is now speaking with pt re; safety issues.  Patient has been refusing to have PT.  Patient told me that she was able to go up stairs today during PT, but when speaking with Darl Pikes in PT, she said she only cleared one step and almost fell doing that.  Patient is very anxious about going home as soon as possible with or with out PT, equipment or home health.  CM to be notified, will continue to monitor for status changes. Patient is now refusing CM because she "doesn't want to wait an hour" .  Will discuss this issue with CM.

## 2013-07-30 NOTE — Progress Notes (Signed)
PT informed RN regarding patient refusing PT and HH services.  Charge nurse was informed as well as case Production designer, theatre/television/film.  Patient refusing services because she doesn't want to wait any longer to go home although according to PT she still remains unsteady and unsafe.  Despite medical advice, pt refusing additional services to assist with her recovery.  Charge nurse gave patient discharge instructions.  Patient denies any questions or concerns regarding discharge instructions.  IV access removed, pt tolerated removal well and no redness, bleeding or drainage.  Pt to be discharged per wheelchair with husband to home in [place].

## 2013-07-30 NOTE — Discharge Summary (Signed)
Patient ID: Tonya Rodgers MRN: [id number] DOB/AGE: Nov 19, 1950 62 y.o.  Admit date: 07/28/2013 Discharge date: 07/30/2013  Admission Diagnoses:  Active Problems:   * No active hospital problems. *   Discharge Diagnoses:  Same  Past Medical History  Diagnosis Date  . Thyroid disease   . Hypertension   . Arthritis   . Depression   . PTSD (post-traumatic stress disorder)     Surgeries: Procedure(s): OPEN REDUCTION INTERNAL FIXATION (ORIF) ANKLE FRACTURE IRRIGATION AND DEBRIDEMENT EXTREMITY on 07/28/2013 - 07/29/2013   Consultants:    Discharged Condition: Improved  Hospital Course: Tonya Rodgers is an 62 y.o. female who was admitted 07/28/2013 for operative treatment of<principal problem not specified>. Patient has severe unremitting pain that affects sleep, daily activities, and work/hobbies. After pre-op clearance the patient was taken to the operating room on 07/28/2013 - 07/29/2013 and underwent  Procedure(s): OPEN REDUCTION INTERNAL FIXATION (ORIF) ANKLE FRACTURE IRRIGATION AND DEBRIDEMENT EXTREMITY.    Patient was given perioperative antibiotics: Anti-infectives   Start     Dose/Rate Route Frequency Ordered Stop   07/29/13 0515  clindamycin (CLEOCIN) IVPB 600 mg     600 mg 100 mL/hr over 30 Minutes Intravenous Every 6 hours 07/29/13 0501 07/31/13 0514   07/29/13 0030  vancomycin (VANCOCIN) IVPB 1000 mg/200 mL premix     1,000 mg 200 mL/hr over 60 Minutes Intravenous  Once 07/29/13 0026 07/29/13 0223       Patient was given sequential compression devices, early ambulation, and chemoprophylaxis to prevent DVT.  Patient benefited maximally from hospital stay and there were no complications.    Recent vital signs: Patient Vitals for the past 24 hrs:  BP Temp Temp src Pulse Resp SpO2 Height Weight  07/30/13 0539 105/57 mmHg 98.8 F (37.1 C) Oral 72 19 98 % - -  07/29/13 2048 124/72 mmHg 98.6 F (37 C) Oral 83 18 99 % - -  07/29/13 1700 - - - - - - 5' 7.5" (1.715 m)  86.183 kg (190 lb)  07/29/13 1358 106/65 mmHg 98.3 F (36.8 C) Oral 80 18 93 % - -     Recent laboratory studies:  Recent Labs  07/29/13 0024 07/29/13 0054 07/29/13 0625 07/30/13 0500  WBC 7.5  --  7.3  --   HGB 13.5 13.9 12.5  --   HCT 37.2 41.0 35.6*  --   PLT 234  --  204  --   NA  --  131*  --  130*  K  --  3.6  --  4.0  CL  --  96  --  95*  CO2  --   --   --  23  BUN  --  16  --  11  CREATININE  --  1.30*  --  0.87  GLUCOSE  --  119*  --  114*  CALCIUM  --   --   --  8.1*     Discharge Medications:     Medication List         albuterol 108 (90 BASE) MCG/ACT inhaler  Commonly known as:  PROVENTIL HFA;VENTOLIN HFA  Inhale 2 puffs into the lungs every 6 (six) hours as needed for wheezing.     aspirin EC 325 MG tablet  Take 1 tablet (325 mg total) by mouth 2 (two) times daily.     doxycycline 50 MG capsule  Commonly known as:  VIBRAMYCIN  Take 50 mg by mouth 2 (two) times daily.     levothyroxine 150  MCG tablet  Commonly known as:  SYNTHROID, LEVOTHROID  Take 150 mcg by mouth daily before breakfast.     metronidazole 1 % cream  Commonly known as:  NORITATE  Apply 1 application topically daily. Uses sparingly     naproxen sodium 220 MG tablet  Commonly known as:  ANAPROX  Take 440 mg by mouth daily as needed (for headaches).     OVER THE COUNTER MEDICATION  Take 1 tablet by mouth daily. "allergy medication-special order"     oxyCODONE-acetaminophen 5-325 MG per tablet  Commonly known as:  ROXICET  Take 1-2 tablets by mouth every 4 (four) hours as needed for pain.     sertraline 50 MG tablet  Commonly known as:  ZOLOFT  Take 50 mg by mouth daily.     valsartan-hydrochlorothiazide 160-12.5 MG per tablet  Commonly known as:  DIOVAN-HCT  Take 1 tablet by mouth daily.        Diagnostic Studies: Ct Head Wo Contrast  07/29/2013   *RADIOLOGY REPORT*  Clinical Data:  Fall next field none available  CT HEAD WITHOUT CONTRAST CT CERVICAL SPINE WITHOUT  CONTRAST  Technique:  Multidetector CT imaging of the head and cervical spine was performed following the standard protocol without intravenous contrast.  Multiplanar CT image reconstructions of the cervical spine were also generated.  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  There is minimal mass effect on the adjacent pons.  The fourth ventricles widely patent. No significant edema is seen within the adjacent brain parenchyma. No extension into the temporal bone is appreciated, although poorly evaluated this examination.  Postoperative changes from prior cranioplasty are seen at the left occiput.  There is no acute intracranial hemorrhage or infarct. Gray-white matter differentiation is preserved.  No midline shift.  No extra- axial fluid collection.  Orbits are normal.  Paranasal sinuses and mastoid air cells are clear.  IMPRESSION:  1.  No acute intracranial process. 2. Postoperative changes from prior left occipital craniotomy with cranioplasty with calcified left cerebellopontine angle mass. Correlation with the history and prior studies is recommended.  CT CERVICAL SPINE  Findings: There is no acute fracture or listhesis identified within the cervical spine. To body heights are preserved.  Normal C1-2 articulations are intact.  Facet joints are aligned.  Reversal of the normal cervical lordosis is present with apex at the C5-6 level.  Prominent left-sided facet arthrosis is present at C2., C3- 4.  Degenerative intervertebral disc space narrowing with endplate osteophytosis present at C5-6.  IMPRESSION: 1.  No evidence of acute fracture or listhesis within the cervical spine. 2.  Degenerative disc disease and facet arthrosis as above, most severe at C5-6   Original Report Authenticated By: Rise Mu, M.D.   Ct Cervical Spine Wo Contrast  07/29/2013   *RADIOLOGY REPORT*  Clinical Data:  Fall next  field none available  CT HEAD WITHOUT CONTRAST CT CERVICAL SPINE WITHOUT CONTRAST  Technique:  Multidetector CT imaging of the head and cervical spine was performed following the standard protocol without intravenous contrast.  Multiplanar CT image reconstructions of the cervical spine were also generated.  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  There is minimal mass effect on the adjacent pons.  The fourth ventricles widely patent. No significant edema is  seen within the adjacent brain parenchyma. No extension into the temporal bone is appreciated, although poorly evaluated this examination.  Postoperative changes from prior cranioplasty are seen at the left occiput.  There is no acute intracranial hemorrhage or infarct. Gray-white matter differentiation is preserved.  No midline shift.  No extra- axial fluid collection.  Orbits are normal.  Paranasal sinuses and mastoid air cells are clear.  IMPRESSION:  1.  No acute intracranial process. 2. Postoperative changes from prior left occipital craniotomy with cranioplasty with calcified left cerebellopontine angle mass. Correlation with the history and prior studies is recommended.  CT CERVICAL SPINE  Findings: There is no acute fracture or listhesis identified within the cervical spine. To body heights are preserved.  Normal C1-2 articulations are intact.  Facet joints are aligned.  Reversal of the normal cervical lordosis is present with apex at the C5-6 level.  Prominent left-sided facet arthrosis is present at C2., C3- 4.  Degenerative intervertebral disc space narrowing with endplate osteophytosis present at C5-6.  IMPRESSION: 1.  No evidence of acute fracture or listhesis within the cervical spine. 2.  Degenerative disc disease and facet arthrosis as above, most severe at C5-6   Original Report Authenticated By: Rise Mu, M.D.   Dg Ankle Left  Port  07/29/2013   CLINICAL DATA:  Postreduction.  EXAM: PORTABLE LEFT ANKLE - 2 VIEW  COMPARISON:  Radiography from earlier the same day  FINDINGS: Partial relocation of the ankle joint. There is still significant lateral displacement and subluxation however. The medial malleolus remains in continuity with the talus. Oblique distal fibular fracture. A splint has been applied.  IMPRESSION: Improved, but still significantly subluxed, ankle joint alignment status post open fracture with ankle mortise and distal syndesmotic disruption.   Electronically Signed   By: Tiburcio Pea   On: 07/29/2013 06:23   Dg Ankle Left Port  07/29/2013   CLINICAL DATA:  History of trauma from a fall.  EXAM: PORTABLE LEFT ANKLE - 2 VIEW  COMPARISON:  No priors.  FINDINGS: Two views of the left ankle demonstrate comminuted fractures of the distal tibia and fibula with complete dislocation at the tibiotalar joint. The tibial fracture appears to extend to the skin surface, suggesting an open wound. The fibular fracture is angulated approximately 45 degrees laterally. The talus is completely dislocated laterally relative to the distal tibia. Gas is present in the overlying soft tissues.  IMPRESSION: Severe open comminuted fracture-dislocation of the left ankle joint, as discussed above.   Electronically Signed   By: Trudie Reed M.D.   On: 07/29/2013 01:40    Disposition: Final discharge disposition not confirmed      Discharge Orders   Future Orders Complete By Expires   Call MD / Call 911  As directed    Comments:     If you experience chest pain or shortness of breath, CALL 911 and be transported to the hospital emergency room.  If you develope a fever above 101 F, pus (white drainage) or increased drainage or redness at the wound, or calf pain, call your surgeon's office.   Call MD / Call 911  As directed    Comments:     If you experience chest pain or shortness of breath, CALL 911 and be transported to the  hospital emergency room.  If you develope a fever above 101 F, pus (white drainage) or increased drainage or redness at the wound, or calf pain, call your surgeon's office.   Constipation Prevention  As directed    Comments:     Drink plenty of fluids.  Prune juice may be helpful.  You may use a stool softener, such as Colace (over the counter) 100 mg twice a day.  Use MiraLax (over the counter) for constipation as needed.   Constipation Prevention  As directed    Comments:     Drink plenty of fluids.  Prune juice may be helpful.  You may use a stool softener, such as Colace (over the counter) 100 mg twice a day.  Use MiraLax (over the counter) for constipation as needed.   Diet - low sodium heart healthy  As directed    Diet - low sodium heart healthy  As directed    Discharge instructions  As directed    Comments:     Non weight bearing on left lower extrimity. Follow up in office with Dr. Ave Filter in 2 weeks   Driving restrictions  As directed    Comments:     No driving while in splint/ until cleared by physician.   Driving restrictions  As directed    Comments:     No driving for 2 weeks   Increase activity slowly as tolerated  As directed       Follow-up Information   Follow up with Mable Paris, MD. Schedule an appointment as soon as possible for a visit in 10 days.   Specialty:  Orthopedic Surgery   Contact information:   [address] [place] [address] [phone]        Signed: Vear Clock, Marquita Lias R 07/30/2013, 1:25 PM

## 2013-07-30 NOTE — Progress Notes (Signed)
Pt states she has walker at home, she only wants PT services in [place], [place].

## 2013-07-30 NOTE — Progress Notes (Signed)
Physical Therapy Treatment Patient Details Name: Jahnasia Tatum MRN: [id number] DOB: [birth year] Today's Date: 07/30/2013 Time: 4098-1191 PT Time Calculation (min): 38 min  PT Assessment / Plan / Recommendation  History of Present Illness Fall at home resulting in Lt ankle dislocation; s/p ORIF Lt ankle and I&D Lt LE   PT Comments   Patient continues to be very unsteady with gait.  Attempted stairs - patient unable to do.  Encouraged patient to sit on steps and bump up on bottom to get into her house today.  Also recommended HHPT and w/c.  Patient declined both, stating "I'll be OK"  Concerned about fall when patient returns home.  Two near-falls during PT session today on stairs and backing up to chair.  Follow Up Recommendations  Supervision/Assistance - 24 hour;Home health PT (Patient declining any follow-up HHPT services)     Does the patient have the potential to tolerate intense rehabilitation     Barriers to Discharge        Equipment Recommendations  Wheelchair (measurements PT);Wheelchair cushion (measurements PT);3in1 (PT) (Patient states she has a walker, and that she will get w/c)    Recommendations for Other Services    Frequency Min 4X/week   Progress towards PT Goals Progress towards PT goals: Progressing toward goals  Plan Discharge plan needs to be updated    Precautions / Restrictions Precautions Precautions: Fall Precaution Comments: pt reports she has had multiple falls at home and has had balance issues since her brain surgery in 2010 Restrictions Weight Bearing Restrictions: Yes LLE Weight Bearing: Non weight bearing   Pertinent Vitals/Pain     Mobility  Bed Mobility Bed Mobility: Supine to Sit;Sitting - Scoot to Edge of Bed Supine to Sit: 5: Supervision;HOB elevated;With rails Sitting - Scoot to Edge of Bed: 5: Supervision;With rail Details for Bed Mobility Assistance: Verbal cues for safety. Transfers Transfers: Sit to Stand;Stand to Sit Sit to  Stand: 4: Min assist;With upper extremity assist;From bed;With armrests;From chair/3-in-1 Stand to Sit: 4: Min assist;3: Mod assist;With upper extremity assist;With armrests;To chair/3-in-1 Details for Transfer Assistance: Verbal cues for hand placement and technique.  Verbal and tactile cues to slow down and move at safer pace.  When returning to sitting, patient began sitting before she reached chair and "plopped" into chair, requiring PT mod assist to prevent patient from missing chair.  Patient states "I didn't know I shouldn't plop into chair" Ambulation/Gait Ambulation/Gait Assistance: 4: Min assist Ambulation Distance (Feet): 12 Feet Assistive device: Rolling walker Ambulation/Gait Assistance Details: Verbal and tactile cues to safely maneuver RW.  Patient hops on RLE, with minimal control of LLE in air - hitting RW and stair).  Unsteady and unsafe with ambulation. Requires physical assist to ambulate safely. Gait Pattern: Step-to pattern Gait velocity: decr Stairs: Yes Stairs Assistance: 3: Mod assist Stairs Assistance Details (indicate cue type and reason): Instructed patient to go up stairs backward with RW or sideways using one rail.  Attempted to practice up backward on shorter step.  Patient hopped up to step, but unable to straighten RLE to stand.  Required max assist to return to upright standing and prevent fall.  Unable to maneuver on stairs.  Instructed patient to sit on stairs and bump up on her bottom. Stair Management Technique: No rails;Step to pattern;Backwards;With walker Number of Stairs: 1     PT Goals (current goals can now be found in the care plan section)    Visit Information  Last PT Received On: 07/30/13 Assistance Needed: +2 (  for steps) History of Present Illness: Fall at home resulting in Lt ankle dislocation; s/p ORIF Lt ankle and I&D Lt LE    Subjective Data  Subjective: "I'm going to be OK"  Patient refusing HHPT follow-up and w/c.  States she will have  her husband get her a w/c.   Cognition  Cognition Arousal/Alertness: Awake/alert Behavior During Therapy: Restless Overall Cognitive Status: History of cognitive impairments - at baseline (Patient with decreased safety awareness/judgement)    Balance     End of Session PT - End of Session Equipment Utilized During Treatment: Gait belt Activity Tolerance: Patient limited by pain;Patient limited by fatigue Patient left: in chair;with call bell/phone within reach Nurse Communication: Mobility status (Need for f/u therapy - patient declining)   GP     Vena Austria 07/30/2013, 2:42 PM Durenda Hurt. Renaldo Fiddler, [facility] Acute Rehab Services Pager [phone]

## 2013-08-01 ENCOUNTER — Encounter (HOSPITAL_COMMUNITY): Payer: Self-pay | Admitting: Orthopedic Surgery

## 2014-09-25 ENCOUNTER — Encounter (HOSPITAL_COMMUNITY)
Admission: RE | Admit: 2014-09-25 | Discharge: 2014-09-25 | Disposition: A | Discharge status: Home / Self Care | Payer: Medicare Other | Source: Ambulatory Visit | Attending: Orthopedic Surgery | Admitting: Orthopedic Surgery

## 2014-09-25 ENCOUNTER — Encounter (HOSPITAL_COMMUNITY): Payer: Self-pay

## 2014-09-25 ENCOUNTER — Other Ambulatory Visit (HOSPITAL_COMMUNITY): Payer: Self-pay | Admitting: *Deleted

## 2014-09-25 DIAGNOSIS — D509 Iron deficiency anemia, unspecified: Secondary | ICD-10-CM | POA: Diagnosis not present

## 2014-09-25 DIAGNOSIS — H9192 Unspecified hearing loss, left ear: Secondary | ICD-10-CM | POA: Diagnosis not present

## 2014-09-25 DIAGNOSIS — F329 Major depressive disorder, single episode, unspecified: Secondary | ICD-10-CM | POA: Insufficient documentation

## 2014-09-25 DIAGNOSIS — J45909 Unspecified asthma, uncomplicated: Secondary | ICD-10-CM | POA: Insufficient documentation

## 2014-09-25 DIAGNOSIS — F4310 Post-traumatic stress disorder, unspecified: Secondary | ICD-10-CM | POA: Insufficient documentation

## 2014-09-25 DIAGNOSIS — I10 Essential (primary) hypertension: Secondary | ICD-10-CM | POA: Diagnosis not present

## 2014-09-25 DIAGNOSIS — M868X6 Other osteomyelitis, lower leg: Secondary | ICD-10-CM | POA: Diagnosis not present

## 2014-09-25 DIAGNOSIS — Z967 Presence of other bone and tendon implants: Secondary | ICD-10-CM | POA: Diagnosis not present

## 2014-09-25 DIAGNOSIS — M1990 Unspecified osteoarthritis, unspecified site: Secondary | ICD-10-CM | POA: Diagnosis not present

## 2014-09-25 DIAGNOSIS — Z01818 Encounter for other preprocedural examination: Secondary | ICD-10-CM | POA: Insufficient documentation

## 2014-09-25 DIAGNOSIS — L719 Rosacea, unspecified: Secondary | ICD-10-CM | POA: Insufficient documentation

## 2014-09-25 HISTORY — DX: Rosacea, unspecified: L71.9

## 2014-09-25 HISTORY — DX: Other seasonal allergic rhinitis: J30.2

## 2014-09-25 HISTORY — DX: Other specified postprocedural states: Z98.890

## 2014-09-25 HISTORY — DX: Anemia, unspecified: D64.9

## 2014-09-25 HISTORY — DX: Adverse effect of unspecified anesthetic, initial encounter: T41.45XA

## 2014-09-25 HISTORY — DX: Nausea with vomiting, unspecified: R11.2

## 2014-09-25 HISTORY — DX: Other complications of anesthesia, initial encounter: T88.59XA

## 2014-09-25 HISTORY — DX: Unspecified asthma, uncomplicated: J45.909

## 2014-09-25 HISTORY — DX: Unspecified hearing loss, left ear: H91.92

## 2014-09-25 LAB — BASIC METABOLIC PANEL
ANION GAP: 15 (ref 5–15)
BUN: 12 mg/dL (ref 6–23)
CHLORIDE: 92 meq/L — AB (ref 96–112)
CO2: 25 mEq/L (ref 19–32)
Calcium: 9.9 mg/dL (ref 8.4–10.5)
Creatinine, Ser: 0.79 mg/dL (ref 0.50–1.10)
GFR calc non Af Amer: 86 mL/min — ABNORMAL LOW (ref >90)
Glucose, Bld: 93 mg/dL (ref 70–99)
POTASSIUM: 4.3 meq/L (ref 3.7–5.3)
Sodium: 132 mEq/L — ABNORMAL LOW (ref 137–147)

## 2014-09-25 LAB — CBC
HCT: 38.5 % (ref 36.0–46.0)
HEMOGLOBIN: 13.2 g/dL (ref 12.0–15.0)
MCH: 32.1 pg (ref 26.0–34.0)
MCHC: 34.3 g/dL (ref 30.0–36.0)
MCV: 93.7 fL (ref 78.0–100.0)
Platelets: 289 10*3/uL (ref 150–400)
RBC: 4.11 MIL/uL (ref 3.87–5.11)
RDW: 13.9 % (ref 11.5–15.5)
WBC: 5.1 10*3/uL (ref 4.0–10.5)

## 2014-09-25 NOTE — Progress Notes (Addendum)
Pt's PCP is Dr. Helene Kelp at [facility] in [place], [place]. Pt denies any cardiac history, chest pain or sob.  Called Dr. Greggory Keen office to see if pt had an EKG there and she does not. They even looked in records that were sent from her physician in [place] and didn't see one.

## 2014-09-25 NOTE — Pre-Procedure Instructions (Signed)
Tonya Rodgers  09/25/2014   Your procedure is scheduled on:  Thursday, September 28, 2014 at 12:15 PM.   Report to [facility] Entrance "A" Admitting Office at 10:15 AM.   Call this number if you have problems the morning of surgery: [phone]               Any questions prior to day of surgery, please call [phone].   Remember:   Do not eat food or drink liquids after midnight.   Take these medicines the morning of surgery with A SIP OF WATER: levothyroxine (SYNTHROID, LEVOTHROID), sertraline (ZOLOFT), albuterol (PROVENTIL HFA;VENTOLIN HFA) inhaler - if needed.   Do not wear jewelry, make-up or nail polish.  Do not wear lotions, powders, or perfumes. You may wear deodorant.  Do not shave 48 hours prior to surgery. Men may shave face and neck.  Do not bring valuables to the hospital.  [facility] is not responsible                  for any belongings or valuables.               Contacts, dentures or bridgework may not be worn into surgery.  Leave suitcase in the car. After surgery it may be brought to your room.  For patients admitted to the hospital, discharge time is determined by your                treatment team.              Special Instructions:  - Preparing for Surgery  Before surgery, you can play an important role.  Because skin is not sterile, your skin needs to be as free of germs as possible.  You can reduce the number of germs on you skin by washing with CHG (chlorahexidine gluconate) soap before surgery.  CHG is an antiseptic cleaner which kills germs and bonds with the skin to continue killing germs even after washing.  Please DO NOT use if you have an allergy to CHG or antibacterial soaps.  If your skin becomes reddened/irritated stop using the CHG and inform your nurse when you arrive at Short Stay.  Do not shave (including legs and underarms) for at least 48 hours prior to the first CHG shower.  You may shave your face.  Please follow  these instructions carefully:   1.  Shower with CHG Soap the night before surgery and the                                morning of Surgery.  2.  If you choose to wash your hair, wash your hair first as usual with your       normal shampoo.  3.  After you shampoo, rinse your hair and body thoroughly to remove the                      Shampoo.  4.  Use CHG as you would any other liquid soap.  You can apply chg directly       to the skin and wash gently with scrungie or a clean washcloth.  5.  Apply the CHG Soap to your body ONLY FROM THE NECK DOWN.        Do not use on open wounds or open sores.  Avoid contact with your eyes, ears, mouth and genitals (private parts).  Wash genitals (private parts) with your normal soap.  6.  Wash thoroughly, paying special attention to the area where your surgery        will be performed.  7.  Thoroughly rinse your body with warm water from the neck down.  8.  DO NOT shower/wash with your normal soap after using and rinsing off       the CHG Soap.  9.  Pat yourself dry with a clean towel.            10.  Wear clean pajamas.            11.  Place clean sheets on your bed the night of your first shower and do not        sleep with pets.  Day of Surgery  Do not apply any lotions the morning of surgery.  Please wear clean clothes to the hospital.     Please read over the following fact sheets that you were given: Pain Booklet, Coughing and Deep Breathing and Surgical Site Infection Prevention

## 2014-09-25 NOTE — Progress Notes (Signed)
No pre-op orders in EPIC. Called Dr. Nona Dell office and left message requesting orders.

## 2014-09-26 ENCOUNTER — Encounter (HOSPITAL_COMMUNITY): Payer: Self-pay

## 2014-09-26 NOTE — Progress Notes (Signed)
Anesthesia Chart Review:  Patient is a 63 year old female posted for left ankle arthrotomy and debridement of tibial osteomyelitis and removal of deep implants from tibia and fibula on 09/28/14 by Dr. Doran Durand. She is s/p ORIF ankle fracture 07/2103.   Other history includes non-smoker, HTN, depression, asthma, Rosacea, iron deficiency anemia, deaf in left ear, arthritis, PTSD (related to son's death), post-operative N/V.  She also felt previous spinal took a long time to wear off. BMI is 30.8.  PCP is Dr. Helene Kelp with [facility].     EKG on 09/25/14: NSR, possible septal infarct (age undetermined), non-specific ST abnormality.  EKG was felt stable since her 07/29/13 tracing.  She denied any cardiac history, CP, and SOB at her PAT visit.  Preoperative labs noted.  She will be further evaluated by her assigned anesthesiologist on the day of surgery.  If no acute changes then I anticipate that she can proceed as planned.  [facility] Short Stay Center/Anesthesiology Phone [phone] 09/26/2014 11:02 AM

## 2014-09-27 ENCOUNTER — Other Ambulatory Visit: Payer: Self-pay | Admitting: Physician Assistant

## 2014-09-27 NOTE — Progress Notes (Signed)
Notified pt. Of time change. Instructed to arrive at 12:00 noon.

## 2014-09-28 ENCOUNTER — Encounter (HOSPITAL_COMMUNITY)
Admission: RE | Disposition: A | Discharge status: Home / Self Care | Payer: Self-pay | Source: Ambulatory Visit | Attending: Orthopedic Surgery

## 2014-09-28 ENCOUNTER — Encounter (HOSPITAL_COMMUNITY): Payer: Self-pay | Admitting: *Deleted

## 2014-09-28 ENCOUNTER — Inpatient Hospital Stay (HOSPITAL_COMMUNITY)
Admission: RE | Admit: 2014-09-28 | Discharge: 2014-10-02 | DRG: 494 | Disposition: A | Discharge status: Home / Self Care | Payer: Medicare Other | Source: Ambulatory Visit | Attending: Orthopedic Surgery | Admitting: Orthopedic Surgery

## 2014-09-28 ENCOUNTER — Inpatient Hospital Stay (HOSPITAL_COMMUNITY): Payer: Medicare Other | Admitting: Certified Registered Nurse Anesthetist

## 2014-09-28 ENCOUNTER — Inpatient Hospital Stay (HOSPITAL_COMMUNITY): Payer: Medicare Other | Admitting: Vascular Surgery

## 2014-09-28 DIAGNOSIS — E039 Hypothyroidism, unspecified: Secondary | ICD-10-CM | POA: Diagnosis present

## 2014-09-28 DIAGNOSIS — J45909 Unspecified asthma, uncomplicated: Secondary | ICD-10-CM | POA: Diagnosis not present

## 2014-09-28 DIAGNOSIS — I10 Essential (primary) hypertension: Secondary | ICD-10-CM | POA: Diagnosis present

## 2014-09-28 DIAGNOSIS — M86172 Other acute osteomyelitis, left ankle and foot: Secondary | ICD-10-CM | POA: Diagnosis not present

## 2014-09-28 DIAGNOSIS — Z79899 Other long term (current) drug therapy: Secondary | ICD-10-CM | POA: Diagnosis not present

## 2014-09-28 DIAGNOSIS — Z7982 Long term (current) use of aspirin: Secondary | ICD-10-CM

## 2014-09-28 DIAGNOSIS — F4310 Post-traumatic stress disorder, unspecified: Secondary | ICD-10-CM | POA: Diagnosis not present

## 2014-09-28 DIAGNOSIS — M25572 Pain in left ankle and joints of left foot: Secondary | ICD-10-CM | POA: Diagnosis present

## 2014-09-28 DIAGNOSIS — M1990 Unspecified osteoarthritis, unspecified site: Secondary | ICD-10-CM | POA: Diagnosis present

## 2014-09-28 DIAGNOSIS — H9192 Unspecified hearing loss, left ear: Secondary | ICD-10-CM | POA: Diagnosis not present

## 2014-09-28 DIAGNOSIS — R1110 Vomiting, unspecified: Secondary | ICD-10-CM

## 2014-09-28 DIAGNOSIS — Z23 Encounter for immunization: Secondary | ICD-10-CM | POA: Diagnosis not present

## 2014-09-28 DIAGNOSIS — F329 Major depressive disorder, single episode, unspecified: Secondary | ICD-10-CM | POA: Diagnosis present

## 2014-09-28 DIAGNOSIS — M86672 Other chronic osteomyelitis, left ankle and foot: Secondary | ICD-10-CM | POA: Diagnosis present

## 2014-09-28 DIAGNOSIS — M86179 Other acute osteomyelitis, unspecified ankle and foot: Secondary | ICD-10-CM | POA: Diagnosis present

## 2014-09-28 HISTORY — PX: ANKLE ARTHROSCOPY WITH RECONSTRUCTION: SHX5583

## 2014-09-28 HISTORY — PX: ANKLE HARDWARE REMOVAL: SHX1149

## 2014-09-28 HISTORY — DX: Hypothyroidism, unspecified: E03.9

## 2014-09-28 HISTORY — DX: Pure hypercholesterolemia, unspecified: E78.00

## 2014-09-28 SURGERY — ARTHROSCOPY, ANKLE, WITH RECONSTRUCTION
Anesthesia: Regional | Site: Ankle | Laterality: Left

## 2014-09-28 MED ORDER — MIDAZOLAM HCL 2 MG/2ML IJ SOLN
1.0000 mg | INTRAMUSCULAR | Status: DC | PRN
  Administered 2014-09-28: 2 mg via INTRAVENOUS
  Filled 2014-09-28: qty 2

## 2014-09-28 MED ORDER — ONDANSETRON HCL 4 MG/2ML IJ SOLN
INTRAMUSCULAR | Status: AC
  Filled 2014-09-28: qty 2

## 2014-09-28 MED ORDER — SODIUM CHLORIDE 0.9 % IV SOLN: INTRAVENOUS | Status: DC

## 2014-09-28 MED ORDER — VANCOMYCIN HCL 10 G IV SOLR
1250.0000 mg | Freq: Two times a day (BID) | INTRAVENOUS | Status: DC
  Administered 2014-09-28 – 2014-09-29 (×3): 1250 mg via INTRAVENOUS
  Filled 2014-09-28 (×6): qty 1250

## 2014-09-28 MED ORDER — OXYCODONE HCL 5 MG PO TABS: 5.0000 mg | ORAL_TABLET | Freq: Once | ORAL | Status: DC | PRN

## 2014-09-28 MED ORDER — VANCOMYCIN HCL IN DEXTROSE 1-5 GM/200ML-% IV SOLN
1000.0000 mg | INTRAVENOUS | Status: AC
  Administered 2014-09-28: 1000 mg via INTRAVENOUS
  Filled 2014-09-28: qty 200

## 2014-09-28 MED ORDER — ROPIVACAINE HCL 5 MG/ML IJ SOLN
INTRAMUSCULAR | Status: DC | PRN
  Administered 2014-09-28: 30 mL via PERINEURAL
  Administered 2014-09-28: 20 mL via PERINEURAL

## 2014-09-28 MED ORDER — ACETAMINOPHEN 500 MG PO TABS
1000.0000 mg | ORAL_TABLET | Freq: Once | ORAL | Status: AC
  Administered 2014-09-28: 1000 mg via ORAL

## 2014-09-28 MED ORDER — LIDOCAINE HCL (CARDIAC) 20 MG/ML IV SOLN
INTRAVENOUS | Status: DC | PRN
  Administered 2014-09-28: 60 mg via INTRAVENOUS

## 2014-09-28 MED ORDER — EPHEDRINE SULFATE 50 MG/ML IJ SOLN
INTRAMUSCULAR | Status: AC
  Filled 2014-09-28: qty 1

## 2014-09-28 MED ORDER — EPHEDRINE SULFATE 50 MG/ML IJ SOLN
INTRAMUSCULAR | Status: DC | PRN
  Administered 2014-09-28 (×3): 10 mg via INTRAVENOUS

## 2014-09-28 MED ORDER — ENOXAPARIN SODIUM 40 MG/0.4ML ~~LOC~~ SOLN
40.0000 mg | SUBCUTANEOUS | Status: DC
  Administered 2014-09-29 – 2014-10-02 (×4): 40 mg via SUBCUTANEOUS
  Filled 2014-09-28 (×6): qty 0.4

## 2014-09-28 MED ORDER — SODIUM CHLORIDE 0.9 % IR SOLN
Status: DC | PRN
  Administered 2014-09-28: 1000 mL

## 2014-09-28 MED ORDER — PROPOFOL 10 MG/ML IV BOLUS
INTRAVENOUS | Status: AC
  Filled 2014-09-28: qty 20

## 2014-09-28 MED ORDER — FENTANYL CITRATE 0.05 MG/ML IJ SOLN
INTRAMUSCULAR | Status: AC
  Filled 2014-09-28: qty 5

## 2014-09-28 MED ORDER — OXYCODONE HCL 5 MG PO TABS
5.0000 mg | ORAL_TABLET | ORAL | Status: DC | PRN
  Administered 2014-09-29 – 2014-10-01 (×10): 10 mg via ORAL
  Filled 2014-09-28 (×10): qty 2

## 2014-09-28 MED ORDER — FENTANYL CITRATE 0.05 MG/ML IJ SOLN
50.0000 ug | INTRAMUSCULAR | Status: DC | PRN
  Administered 2014-09-28: 100 ug via INTRAVENOUS
  Filled 2014-09-28: qty 2

## 2014-09-28 MED ORDER — ROCURONIUM BROMIDE 50 MG/5ML IV SOLN
INTRAVENOUS | Status: AC
  Filled 2014-09-28: qty 1

## 2014-09-28 MED ORDER — ALBUTEROL SULFATE (2.5 MG/3ML) 0.083% IN NEBU: 2.5000 mg | INHALATION_SOLUTION | Freq: Four times a day (QID) | RESPIRATORY_TRACT | Status: DC | PRN

## 2014-09-28 MED ORDER — METRONIDAZOLE 1 % EX CREA
1.0000 "application " | TOPICAL_CREAM | Freq: Two times a day (BID) | CUTANEOUS | Status: DC
  Administered 2014-09-30: 1 via TOPICAL

## 2014-09-28 MED ORDER — PHENYLEPHRINE HCL 10 MG/ML IJ SOLN
INTRAMUSCULAR | Status: DC | PRN
  Administered 2014-09-28: 80 ug via INTRAVENOUS

## 2014-09-28 MED ORDER — MIDAZOLAM HCL 5 MG/5ML IJ SOLN
INTRAMUSCULAR | Status: DC | PRN
  Administered 2014-09-28: 2 mg via INTRAVENOUS

## 2014-09-28 MED ORDER — HYDROMORPHONE HCL 1 MG/ML IJ SOLN
0.5000 mg | INTRAMUSCULAR | Status: DC | PRN
  Administered 2014-09-29 – 2014-09-30 (×10): 1 mg via INTRAVENOUS
  Filled 2014-09-28 (×10): qty 1

## 2014-09-28 MED ORDER — SODIUM CHLORIDE 0.9 % IR SOLN
Status: DC | PRN
  Administered 2014-09-28: 3000 mL

## 2014-09-28 MED ORDER — CHLORHEXIDINE GLUCONATE 4 % EX LIQD
60.0000 mL | Freq: Once | CUTANEOUS | Status: DC
  Filled 2014-09-28: qty 60

## 2014-09-28 MED ORDER — DOCUSATE SODIUM 100 MG PO CAPS
100.0000 mg | ORAL_CAPSULE | Freq: Two times a day (BID) | ORAL | Status: DC
  Administered 2014-09-28 – 2014-10-02 (×8): 100 mg via ORAL
  Filled 2014-09-28 (×8): qty 1

## 2014-09-28 MED ORDER — SODIUM CHLORIDE 0.9 % IV SOLN
INTRAVENOUS | Status: DC
  Administered 2014-09-28 – 2014-09-29 (×2): via INTRAVENOUS

## 2014-09-28 MED ORDER — SODIUM CHLORIDE 0.9 % IJ SOLN
INTRAMUSCULAR | Status: AC
  Filled 2014-09-28: qty 10

## 2014-09-28 MED ORDER — ONDANSETRON HCL 4 MG/2ML IJ SOLN
4.0000 mg | Freq: Four times a day (QID) | INTRAMUSCULAR | Status: DC | PRN
  Administered 2014-09-30 – 2014-10-01 (×4): 4 mg via INTRAVENOUS
  Filled 2014-09-28 (×4): qty 2

## 2014-09-28 MED ORDER — LEVOTHYROXINE SODIUM 150 MCG PO TABS
150.0000 ug | ORAL_TABLET | Freq: Every day | ORAL | Status: DC
  Administered 2014-09-29 – 2014-10-02 (×4): 150 ug via ORAL
  Filled 2014-09-28 (×7): qty 1

## 2014-09-28 MED ORDER — VALSARTAN-HYDROCHLOROTHIAZIDE 160-12.5 MG PO TABS: 1.0000 | ORAL_TABLET | Freq: Every day | ORAL | Status: DC

## 2014-09-28 MED ORDER — ONDANSETRON HCL 4 MG PO TABS: 4.0000 mg | ORAL_TABLET | Freq: Four times a day (QID) | ORAL | Status: DC | PRN

## 2014-09-28 MED ORDER — INFLUENZA VAC SPLIT QUAD 0.5 ML IM SUSY
0.5000 mL | PREFILLED_SYRINGE | INTRAMUSCULAR | Status: AC
  Administered 2014-09-29: 0.5 mL via INTRAMUSCULAR
  Filled 2014-09-28: qty 0.5

## 2014-09-28 MED ORDER — LACTATED RINGERS IV SOLN
INTRAVENOUS | Status: DC
  Administered 2014-09-28 (×3): via INTRAVENOUS

## 2014-09-28 MED ORDER — PIPERACILLIN-TAZOBACTAM 3.375 G IVPB
3.3750 g | Freq: Three times a day (TID) | INTRAVENOUS | Status: DC
  Administered 2014-09-28 – 2014-10-01 (×8): 3.375 g via INTRAVENOUS
  Filled 2014-09-28 (×10): qty 50

## 2014-09-28 MED ORDER — OXYCODONE HCL 5 MG/5ML PO SOLN: 5.0000 mg | Freq: Once | ORAL | Status: DC | PRN

## 2014-09-28 MED ORDER — HYDROCHLOROTHIAZIDE 12.5 MG PO CAPS
12.5000 mg | ORAL_CAPSULE | Freq: Every day | ORAL | Status: DC
  Administered 2014-09-29 – 2014-10-02 (×4): 12.5 mg via ORAL
  Filled 2014-09-28 (×4): qty 1

## 2014-09-28 MED ORDER — MIDAZOLAM HCL 2 MG/2ML IJ SOLN
INTRAMUSCULAR | Status: AC
  Filled 2014-09-28: qty 2

## 2014-09-28 MED ORDER — VANCOMYCIN HCL 500 MG IV SOLR
INTRAVENOUS | Status: AC
  Filled 2014-09-28: qty 500

## 2014-09-28 MED ORDER — DEXAMETHASONE SODIUM PHOSPHATE 4 MG/ML IJ SOLN
INTRAMUSCULAR | Status: AC
  Filled 2014-09-28: qty 1

## 2014-09-28 MED ORDER — ACETAMINOPHEN 500 MG PO TABS
ORAL_TABLET | ORAL | Status: AC
  Administered 2014-09-28: 1000 mg via ORAL
  Filled 2014-09-28: qty 2

## 2014-09-28 MED ORDER — FENTANYL CITRATE 0.05 MG/ML IJ SOLN
INTRAMUSCULAR | Status: DC | PRN
  Administered 2014-09-28 (×2): 50 ug via INTRAVENOUS

## 2014-09-28 MED ORDER — LIDOCAINE HCL (CARDIAC) 20 MG/ML IV SOLN
INTRAVENOUS | Status: AC
  Filled 2014-09-28: qty 5

## 2014-09-28 MED ORDER — VANCOMYCIN HCL 500 MG IV SOLR
INTRAVENOUS | Status: DC | PRN
  Administered 2014-09-28: 500 mg

## 2014-09-28 MED ORDER — SERTRALINE HCL 50 MG PO TABS
50.0000 mg | ORAL_TABLET | ORAL | Status: DC
  Administered 2014-09-30 – 2014-10-02 (×2): 50 mg via ORAL
  Filled 2014-09-28 (×2): qty 1

## 2014-09-28 MED ORDER — HYDROMORPHONE HCL 1 MG/ML IJ SOLN: 0.2500 mg | INTRAMUSCULAR | Status: DC | PRN

## 2014-09-28 MED ORDER — IRBESARTAN 150 MG PO TABS
150.0000 mg | ORAL_TABLET | Freq: Every day | ORAL | Status: DC
  Administered 2014-09-29 – 2014-10-02 (×4): 150 mg via ORAL
  Filled 2014-09-28 (×4): qty 1

## 2014-09-28 MED ORDER — SENNA 8.6 MG PO TABS
1.0000 | ORAL_TABLET | Freq: Two times a day (BID) | ORAL | Status: DC
  Administered 2014-09-28 – 2014-10-02 (×8): 8.6 mg via ORAL
  Filled 2014-09-28 (×12): qty 1

## 2014-09-28 MED ORDER — PROPOFOL 10 MG/ML IV BOLUS
INTRAVENOUS | Status: DC | PRN
  Administered 2014-09-28: 160 mg via INTRAVENOUS

## 2014-09-28 SURGICAL SUPPLY — 73 items
BANDAGE ESMARK 6X9 LF (GAUZE/BANDAGES/DRESSINGS) ×1 IMPLANT
BLADE SAW SGTL HD 18.5X60.5X1. (BLADE) ×3 IMPLANT
BLADE SURG 10 STRL SS (BLADE) ×3 IMPLANT
BNDG COHESIVE 4X5 TAN STRL (GAUZE/BANDAGES/DRESSINGS) ×3 IMPLANT
BNDG COHESIVE 6X5 TAN STRL LF (GAUZE/BANDAGES/DRESSINGS) ×3 IMPLANT
BNDG ESMARK 6X9 LF (GAUZE/BANDAGES/DRESSINGS) ×3
BNDG GAUZE ELAST 4 BULKY (GAUZE/BANDAGES/DRESSINGS) ×3 IMPLANT
CANISTER SUCT 3000ML (MISCELLANEOUS) ×3 IMPLANT
CHLORAPREP W/TINT 10.5 ML (MISCELLANEOUS) ×3 IMPLANT
CHLORAPREP W/TINT 26ML (MISCELLANEOUS) ×3 IMPLANT
CLOSURE WOUND 1/2 X4 (GAUZE/BANDAGES/DRESSINGS) ×1
CONT SPEC 4OZ CLIKSEAL STRL BL (MISCELLANEOUS) ×3 IMPLANT
COVER SURGICAL LIGHT HANDLE (MISCELLANEOUS) ×3 IMPLANT
CUFF TOURNIQUET SINGLE 34IN LL (TOURNIQUET CUFF) ×3 IMPLANT
CUFF TOURNIQUET SINGLE 44IN (TOURNIQUET CUFF) IMPLANT
DRAPE C-ARM 42X72 X-RAY (DRAPES) ×3 IMPLANT
DRAPE C-ARMOR (DRAPES) ×3 IMPLANT
DRAPE INCISE IOBAN 66X45 STRL (DRAPES) ×3 IMPLANT
DRAPE U-SHAPE 47X51 STRL (DRAPES) ×3 IMPLANT
DRSG ADAPTIC 3X8 NADH LF (GAUZE/BANDAGES/DRESSINGS) ×3 IMPLANT
DRSG PAD ABDOMINAL 8X10 ST (GAUZE/BANDAGES/DRESSINGS) ×6 IMPLANT
DRSG TEGADERM 4X4.75 (GAUZE/BANDAGES/DRESSINGS) ×3 IMPLANT
ELECT REM PT RETURN 9FT ADLT (ELECTROSURGICAL) ×3
ELECTRODE REM PT RTRN 9FT ADLT (ELECTROSURGICAL) ×1 IMPLANT
GAUZE SPONGE 2X2 8PLY STRL LF (GAUZE/BANDAGES/DRESSINGS) ×1 IMPLANT
GAUZE SPONGE 4X4 12PLY STRL (GAUZE/BANDAGES/DRESSINGS) ×3 IMPLANT
GLOVE BIO SURGEON STRL SZ 6.5 (GLOVE) ×2 IMPLANT
GLOVE BIO SURGEON STRL SZ7 (GLOVE) ×6 IMPLANT
GLOVE BIO SURGEON STRL SZ8 (GLOVE) ×3 IMPLANT
GLOVE BIO SURGEONS STRL SZ 6.5 (GLOVE) ×1
GLOVE BIOGEL PI IND STRL 6.5 (GLOVE) ×1 IMPLANT
GLOVE BIOGEL PI IND STRL 8 (GLOVE) ×1 IMPLANT
GLOVE BIOGEL PI INDICATOR 6.5 (GLOVE) ×2
GLOVE BIOGEL PI INDICATOR 8 (GLOVE) ×2
GOWN STRL REUS W/ TWL LRG LVL3 (GOWN DISPOSABLE) ×2 IMPLANT
GOWN STRL REUS W/ TWL XL LVL3 (GOWN DISPOSABLE) ×1 IMPLANT
GOWN STRL REUS W/TWL LRG LVL3 (GOWN DISPOSABLE) ×4
GOWN STRL REUS W/TWL XL LVL3 (GOWN DISPOSABLE) ×2
KIT BASIN OR (CUSTOM PROCEDURE TRAY) ×3 IMPLANT
KIT ROOM TURNOVER OR (KITS) ×3 IMPLANT
KIT STIMULAN RAPID CURE 5CC (Orthopedic Implant) ×3 IMPLANT
NEEDLE 22X1 1/2 (OR ONLY) (NEEDLE) IMPLANT
NS IRRIG 1000ML POUR BTL (IV SOLUTION) ×3 IMPLANT
PACK ORTHO EXTREMITY (CUSTOM PROCEDURE TRAY) ×3 IMPLANT
PAD ARMBOARD 7.5X6 YLW CONV (MISCELLANEOUS) ×6 IMPLANT
PAD CAST 4YDX4 CTTN HI CHSV (CAST SUPPLIES) ×1 IMPLANT
PADDING CAST COTTON 4X4 STRL (CAST SUPPLIES) ×2
PADDING CAST COTTON 6X4 STRL (CAST SUPPLIES) ×3 IMPLANT
SOAP 2 % CHG 4 OZ (WOUND CARE) ×3 IMPLANT
SPLINT PLASTER CAST XFAST 5X30 (CAST SUPPLIES) ×1 IMPLANT
SPLINT PLASTER XFAST SET 5X30 (CAST SUPPLIES) ×2
SPONGE GAUZE 2X2 STER 10/PKG (GAUZE/BANDAGES/DRESSINGS) ×2
SPONGE LAP 18X18 X RAY DECT (DISPOSABLE) ×3 IMPLANT
STAPLER VISISTAT 35W (STAPLE) IMPLANT
STRIP CLOSURE SKIN 1/2X4 (GAUZE/BANDAGES/DRESSINGS) ×2 IMPLANT
SUCTION FRAZIER TIP 10 FR DISP (SUCTIONS) ×3 IMPLANT
SUT ETHILON 3 0 PS 1 (SUTURE) ×6 IMPLANT
SUT MNCRL AB 3-0 PS2 18 (SUTURE) ×3 IMPLANT
SUT PDS AB 0 CT 36 (SUTURE) ×6 IMPLANT
SUT PDS AB 2-0 CT1 27 (SUTURE) ×3 IMPLANT
SUT PROLENE 3 0 PS 2 (SUTURE) ×3 IMPLANT
SUT VIC AB 2-0 CT1 27 (SUTURE) ×4
SUT VIC AB 2-0 CT1 TAPERPNT 27 (SUTURE) ×2 IMPLANT
SUT VIC AB 3-0 PS2 18 (SUTURE) ×2
SUT VIC AB 3-0 PS2 18XBRD (SUTURE) ×1 IMPLANT
SWAB CULTURE LIQUID MINI MALE (MISCELLANEOUS) ×3 IMPLANT
SYR CONTROL 10ML LL (SYRINGE) IMPLANT
TOWEL OR 17X24 6PK STRL BLUE (TOWEL DISPOSABLE) ×3 IMPLANT
TOWEL OR 17X26 10 PK STRL BLUE (TOWEL DISPOSABLE) ×3 IMPLANT
TUBE ANAEROBIC SPECIMEN COL (MISCELLANEOUS) ×3 IMPLANT
TUBE CONNECTING 12'X1/4 (SUCTIONS) ×1
TUBE CONNECTING 12X1/4 (SUCTIONS) ×2 IMPLANT
WATER STERILE IRR 1000ML POUR (IV SOLUTION) ×3 IMPLANT

## 2014-09-28 NOTE — Anesthesia Procedure Notes (Addendum)
Anesthesia Regional Block:  Adductor canal block  Pre-Anesthetic Checklist: ,, timeout performed, Correct Patient, Correct Site, Correct Laterality, Correct Procedure, Correct Position, site marked, Risks and benefits discussed,  Surgical consent,  Pre-op evaluation,  At surgeon's request and post-op pain management  Laterality: Lower and Left  Prep: chloraprep       Needles:  Injection technique: Single-shot  Needle Type: Echogenic Needle          Additional Needles:  Procedures: ultrasound guided (picture in chart) Adductor canal block Narrative:  Injection made incrementally with aspirations every 5 mL.  Performed by: Personally   Additional Notes: H+P and labs reviewed, risks and benefits discussed with patient, procedure tolerated well without complications   Anesthesia Regional Block:  Popliteal block  Pre-Anesthetic Checklist: ,, timeout performed, Correct Patient, Correct Site, Correct Laterality, Correct Procedure, Correct Position, site marked, Risks and benefits discussed,  Surgical consent,  Pre-op evaluation,  At surgeon's request and post-op pain management  Laterality: Lower and Left  Prep: chloraprep       Needles:  Injection technique: Single-shot  Needle Type: Echogenic Stimulator Needle          Additional Needles:  Procedures: ultrasound guided (picture in chart) and nerve stimulator Popliteal block  Nerve Stimulator or Paresthesia:  Response: planatrflexion, 0.5 mA,   Additional Responses:   Narrative:  Injection made incrementally with aspirations every 5 mL.  Performed by: Personally   Additional Notes: H+P and labs reviewed, risks and benefits discussed with patient, procedure tolerated well without complications

## 2014-09-28 NOTE — Progress Notes (Signed)
ANTIBIOTIC CONSULT NOTE - INITIAL  Pharmacy Consult for vancomycin, zosyn Indication: osteomyelitis  Allergies  Allergen Reactions  . Keflex [Cephalexin] Hives    Patient Measurements: Height: 5\' 8"  (172.7 cm) Weight: 202 lb 9.6 oz (91.899 kg) IBW/kg (Calculated) : 63.9 Adjusted Body Weight:   Vital Signs: Temp: 98 F (36.7 C) (11/19 1826) Temp Source: Oral (11/19 1826) BP: 145/96 mmHg (11/19 1826) Pulse Rate: 79 (11/19 1826) Intake/Output from previous day:   Intake/Output from this shift: Total I/O In: 1365 [P.O.:240; I.V.:1125] Out: -   Labs: No results for input(s): WBC, HGB, PLT, LABCREA, CREATININE in the last 72 hours. Estimated Creatinine Clearance: 83.1 mL/min (by C-G formula based on Cr of 0.79). No results for input(s): VANCOTROUGH, VANCOPEAK, VANCORANDOM, GENTTROUGH, GENTPEAK, GENTRANDOM, TOBRATROUGH, TOBRAPEAK, TOBRARND, AMIKACINPEAK, AMIKACINTROU, AMIKACIN in the last 72 hours.   Microbiology: No results found for this or any previous visit (from the past 720 hour(s)).  Medical History: Past Medical History  Diagnosis Date  . Thyroid disease     hypothyroid  . Hypertension   . Arthritis   . Depression   . PTSD (post-traumatic stress disorder)     hx of this, when son died  . Asthma     related to allergies  . Seasonal allergies   . Rosacea   . Anemia     low iron  . Deafness in left ear   . Complication of anesthesia     had a spinal once that took a long time to get rid of the numbness  . PONV (postoperative nausea and vomiting)     some n/v    Medications:  Prescriptions prior to admission  Medication Sig Dispense Refill Last Dose  . albuterol (PROVENTIL HFA;VENTOLIN HFA) 108 (90 BASE) MCG/ACT inhaler Inhale 2 puffs into the lungs every 6 (six) hours as needed for wheezing.   09/28/2014 at 0630  . levothyroxine (SYNTHROID, LEVOTHROID) 150 MCG tablet Take 150 mcg by mouth daily before breakfast.   09/28/2014 at 0630  . metronidazole  (NORITATE) 1 % cream Apply 1 application topically 2 (two) times daily. Uses sparingly   09/27/2014 at Unknown time  . OVER THE COUNTER MEDICATION Take 1 tablet by mouth daily. "allergy medication-special order"   09/28/2014 at 0630  . sertraline (ZOLOFT) 50 MG tablet Take 50 mg by mouth every other day.    09/28/2014 at 0630  . valsartan-hydrochlorothiazide (DIOVAN-HCT) 160-12.5 MG per tablet Take 1 tablet by mouth daily.   09/28/2014 at 0630  . aspirin EC 325 MG tablet Take 1 tablet (325 mg total) by mouth 2 (two) times daily. (Patient not taking: Reported on 09/22/2014) 30 tablet 0   . oxyCODONE-acetaminophen (ROXICET) 5-325 MG per tablet Take 1-2 tablets by mouth every 4 (four) hours as needed for pain. (Patient not taking: Reported on 09/22/2014) 60 tablet 0    Assessment: 63 yo female with L ankle open fracture over a year ago, POD #0 of removal of hardward, I&D and now with possible osteomyelitis. Rec'd vancomycin in OR, dose was low for body weight, will schedule maintenance dosing to begin earlier than usual. SCr stable, eCrCl 70-75 ml/min.  Goal of Therapy:  Vancomycin trough level 15-20 mcg/ml  Plan:  Zosyn 3.375 g IV q8h Vancomycin 1250 mg IV q12h Monitor renal fx, cultures, duration of therapy Recommend VT at steady state    Hughes Better, PharmD, BCPS Clinical Pharmacist Pager: [phone] 09/28/2014 6:43 PM

## 2014-09-28 NOTE — Progress Notes (Signed)
Received patient from PACU post ankle surgery, patient is alert and oriented, not in any distress, VSS, left leg with good capillary refill, warm to touch, numb, dressing clean, dry and intact, leg elevated. Oriented patient to room ,unit and staff. Will continue to monitor.

## 2014-09-28 NOTE — Anesthesia Preprocedure Evaluation (Signed)
Anesthesia Evaluation  Patient identified by MRN, date of birth, ID band Patient awake    Reviewed: Allergy & Precautions, H&P , NPO status , Patient's Chart, lab work & pertinent test results  History of Anesthesia Complications (+) PONV and history of anesthetic complications  Airway Mallampati: II  TM Distance: >3 FB Neck ROM: Full    Dental  (+) Teeth Intact   Pulmonary neg shortness of breath, asthma , neg sleep apnea, neg COPDneg recent URI,  breath sounds clear to auscultation        Cardiovascular hypertension, Rhythm:Regular     Neuro/Psych PSYCHIATRIC DISORDERS Depression negative neurological ROS     GI/Hepatic negative GI ROS, Neg liver ROS,   Endo/Other  Hypothyroidism   Renal/GU negative Renal ROS     Musculoskeletal Left ankle hardware possible infection   Abdominal   Peds  Hematology  (+) anemia ,   Anesthesia Other Findings   Reproductive/Obstetrics                             Anesthesia Physical Anesthesia Plan  ASA: II  Anesthesia Plan: General and Regional   Post-op Pain Management:    Induction: Intravenous  Airway Management Planned: LMA  Additional Equipment: None  Intra-op Plan:   Post-operative Plan: Extubation in OR  Informed Consent: I have reviewed the patients History and Physical, chart, labs and discussed the procedure including the risks, benefits and alternatives for the proposed anesthesia with the patient or authorized representative who has indicated his/her understanding and acceptance.   Dental advisory given  Plan Discussed with: CRNA and Surgeon  Anesthesia Plan Comments:         Anesthesia Quick Evaluation

## 2014-09-28 NOTE — H&P (Signed)
Tonya Rodgers is an 63 y.o. female.   Chief Complaint:  Left ankle pain HPI:  62 y/o female with PMH of left open ankle fracture 14 mos ago c/o persistent pain and swelling at the left ankle.  Xrays and a CT scan are suggestive of chronic osteomyelitis at the distal tibia.  She has not taken any antibiotics for at least a week.  She was on low dose doxycycline for rosacea until early last week.  No recent f/c/n/v.  Past Medical History  Diagnosis Date  . Thyroid disease     hypothyroid  . Hypertension   . Arthritis   . Depression   . PTSD (post-traumatic stress disorder)     hx of this, when son died  . Asthma     related to allergies  . Seasonal allergies   . Rosacea   . Anemia     low iron  . Deafness in left ear   . Complication of anesthesia     had a spinal once that took a long time to get rid of the numbness  . PONV (postoperative nausea and vomiting)     some n/v    Past Surgical History  Procedure Laterality Date  . Orif ankle fracture Left 07/29/2013    Procedure: OPEN REDUCTION INTERNAL FIXATION (ORIF) ANKLE FRACTURE;  Surgeon: Nita Sells, MD;  Location: [facility];  Service: Orthopedics;  Laterality: Left;  . I&d extremity Left 07/29/2013    Procedure: IRRIGATION AND DEBRIDEMENT EXTREMITY;  Surgeon: Nita Sells, MD;  Location: [facility];  Service: Orthopedics;  Laterality: Left;  . Appendectomy    . Cesarean section      x 2  . Tubal ligation    . Dilation and curettage of uterus    . Brain surgery      for hemi facial spasms  . Colonoscopy      Family History  Problem Relation Age of Onset  . Dementia Mother   . Asthma Father   . Aneurysm Father   . Diabetes type II Father    Social History:  reports that she has never smoked. She has never used smokeless tobacco. She reports that she drinks about 4.2 oz of alcohol per week. She reports that she does not use illicit drugs.  Allergies:  Allergies  Allergen Reactions  . Keflex  [Cephalexin] Hives    Medications Prior to Admission  Medication Sig Dispense Refill  . albuterol (PROVENTIL HFA;VENTOLIN HFA) 108 (90 BASE) MCG/ACT inhaler Inhale 2 puffs into the lungs every 6 (six) hours as needed for wheezing.    Marland Kitchen levothyroxine (SYNTHROID, LEVOTHROID) 150 MCG tablet Take 150 mcg by mouth daily before breakfast.    . metronidazole (NORITATE) 1 % cream Apply 1 application topically 2 (two) times daily. Uses sparingly    . OVER THE COUNTER MEDICATION Take 1 tablet by mouth daily. "allergy medication-special order"    . sertraline (ZOLOFT) 50 MG tablet Take 50 mg by mouth every other day.     . valsartan-hydrochlorothiazide (DIOVAN-HCT) 160-12.5 MG per tablet Take 1 tablet by mouth daily.    Marland Kitchen aspirin EC 325 MG tablet Take 1 tablet (325 mg total) by mouth 2 (two) times daily. (Patient not taking: Reported on 09/22/2014) 30 tablet 0  . oxyCODONE-acetaminophen (ROXICET) 5-325 MG per tablet Take 1-2 tablets by mouth every 4 (four) hours as needed for pain. (Patient not taking: Reported on 09/22/2014) 60 tablet 0    No results found for this or  any previous visit (from the past 48 hour(s)). No results found.  ROS  No recent f/c/n/v/wt loss  Blood pressure 140/79, pulse 72, temperature 98.2 F (36.8 C), temperature source Oral, resp. rate 21, height 5\' 8"  (1.727 m), weight 91.899 kg (202 lb 9.6 oz), SpO2 99 %. Physical Exam  [age] wd female in nad.  A and O x 4.  Mood and affect normal.  EOMI.  resp unlabored.  L ankle with large effusion.  Skin healed medially and laterally.  5/5 strength in PF and DF of the ankle and toes.  No lymphadenopathy.  Sens to LT intact at the dorsal and plantar foot.  Assessment/Plan L ankle chronic osteomyelitis and retained hardware at medial and lateral malleoli - to OR for removal of deep implants and left ankle arthrotomy with I and d of any infected bone.  The risks and benefits of the alternative treatment options have been discussed in detail.   The patient wishes to proceed with surgery and specifically understands risks of bleeding, infection, nerve damage, blood clots, need for additional surgery, amputation and death.   Wylene Simmer 10/09/14, 1:53 PM

## 2014-09-28 NOTE — Brief Op Note (Signed)
09/28/2014  3:48 PM  PATIENT:  Tonya Rodgers  63 y.o. female  PRE-OPERATIVE DIAGNOSIS: 1.  Left ankle effusion      2.  Possible Left tibia osteomyelitis      3.  Retained hardware left distal tibia      4.  Retained hardware left distal fibula  POST-OPERATIVE DIAGNOSIS: 1.  Left ankle post-traumatic arthritis and effusion      2.  Left distal tibia non-union      3.  Possible left tibia osteomyelitis      4.  Retained hardware left distal tibia      5.  Retained hardware left distal fibula  Procedure(s): 1.  Removal of deep implants from the left distal tibia   2.  Removal of deep implants from the left distal fibula (separate incision)   3.  Left ankle arthrotomy with removal of loose bodies   4.  Left tibia sequestrectomy   5.  Left ankle ap and lateral xrays   SURGEON:  Wylene Simmer, MD  ASSISTANT:  Lorin Mercy, PA-C  ANESTHESIA:   General, regional  EBL:  minimal   TOURNIQUET:   Total Tourniquet Time Documented: Thigh (Left) - 66 minutes Total: Thigh (Left) - 66 minutes   COMPLICATIONS:  None apparent  DISPOSITION:  Extubated, awake and stable to recovery.  DICTATION ID:  [id number]

## 2014-09-28 NOTE — Op Note (Signed)
NAME:  Tonya Rodgers, Tonya Rodgers NO.:  [id number]  MEDICAL RECORD NO.:  [id number]  LOCATION:  6N23C                        FACILITY:  [facility]  PHYSICIAN:  Wylene Simmer, MD        DATE OF BIRTH:  [birth year]  DATE OF PROCEDURE:  09/28/2014 DATE OF DISCHARGE:                              OPERATIVE REPORT   PREOPERATIVE DIAGNOSIS: 1. Left ankle effusion. 2. 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 3. Retained hardware, left distal tibia. 4. Retained hardware, left distal fibula.  POSTOPERATIVE DIAGNOSIS: 1. Left ankle posttraumatic arthritis and effusion. 2. Left distal tibia nonunion. 3. 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 4. Retained hardware, left distal tibia. 5. Retained hardware, left distal fibula.  PROCEDURE: 1. Removal of deep implants from the left distal tibia. 2. Removal of deep implants from the left distal fibula through a     separate incision. 3. Left ankle arthrotomy with removal of loose bodies through a     separate incision. 4. Left tibial sequestrectomy. 5. Left ankle AP and lateral radiographs.  SURGEON:  Wylene Simmer, MD.  ASSISTANT:  Lorin Mercy, PA-C.  ANESTHESIA:  General, regional.  ESTIMATED BLOOD LOSS:  Minimal.  TOURNIQUET TIME:  66 minutes at 250 mmHg.  COMPLICATIONS:  None apparent.  DISPOSITION:  Extubated, awake and stable to recovery.  INDICATIONS FOR PROCEDURE:  The patient is a 63 year old woman with past medical history significant for an open ankle fracture, treated with I and D and ORIF last year.  She has developed persistent pain and a large effusion around the ankle.  She had inflammatory markers that were equivocal for signs of infection.  CT scan was concerning for osteomyelitis.  She presents now for left ankle removal of deep implants from the distal tibia and the distal fibula as well as ankle arthrotomy with tibial sequestrectomy.  She has been off of all antibiotics for approximately a week.  She was on doxycycline  50 mg daily for rosacea up until that time.  She understands the risks and benefits, the alternative treatment options, and elects surgical treatment.  She specifically understands risks of bleeding, infection, nerve damage, blood clots, need for additional surgery, continued pain, amputation, and death.  PROCEDURE IN DETAIL:  After preoperative consent was obtained and the correct operative site was identified, the patient was brought to the operating room and placed supine on the operating table.  General anesthesia was induced.  Preoperative antibiotics were held pending cultures.  A surgical time-out was taken.  Left lower extremity was prepped and draped in standard sterile fashion with tourniquet around the thigh.  The extremity was exsanguinated, and the tourniquet was inflated to 250 mmHg.  A lateral incision was made, sharp dissection was carried down through the skin and subcutaneous tissue.  The plate was identified along with all the screw heads.  They were all cleaned of soft tissues.  All screws were removed followed by the plate.  The wound was irrigated and closed with Monocryl and nylon.  Attention was then turned to the medial malleolus where a K-wire was inserted through the skin into the anterior screw.  A stab incision was made and the screw was removed without difficulty.  A  2nd incision was made posteriorly after identifying the head of the screw with the guidewire.  The stab incision was made again, the screw was removed in its entirety.  AP and lateral radiographs showed complete removal of all hardware.  The medial wounds were irrigated and closed with nylon.  Attention was then turned to the anterior aspect of the ankle where longitudinal incision was made in the midline.  Sharp dissection was carried down through the skin and subcutaneous tissue.  The interval between the extensor hallucis longus and tibialis anterior tendon was then developed.  There was  significant thickening and synovitis noted at the anterior aspect of the ankle joint.  The capsule was elevated anteriorly, there was a loose body noted anteriorly and this was removed.  The anterolateral aspect of the tibia was noted to be mobile. A site of nonunion was identified.  This was opened and the fragments of devitalized bone removed.  Swabs were then used at the level of the ankle joint for aerobic and anaerobic cultures from the effusion itself. The cyst was identified at the lateral tibial plafond.  Significant amount of fibrinous soft tissue was removed with a curette and rongeur. This was sent as a specimen to Pathology as well as a specimen to Microbiology for aerobic and anaerobic cultures.  Once all of the loose bodies and fibrinous material were removed, the devitalized bone from the distal tibia was curetted out in the fashion of a sequestrectomy. Wound was then irrigated copiously with 3 L of normal saline. Antibiotic beads were packed into the distal tibial bone void.  The joint capsule was repaired with simple sutures of 2-0 PDS.  The extensor retinaculum was repaired with 2-0 PDS.  Skin incision was closed with a running nylon.  Sterile dressings were applied followed by a well-padded short-leg splint.  Vancomycin was administered intravenously after obtaining cultures.  The patient was then awakened from anesthesia and transported to recovery room in stable condition.  Lorin Mercy, PA-C, was present and scrubbed for the duration of the case.  Her assistance was essential in prepping and draping the patient, gaining and maintaining exposure, performing the operation, closing and dressing the wounds, and applying the splint.  FOLLOWUP PLAN:  The patient will be nonweightbearing on the left lower extremity.  She will be admitted and started on IV vancomycin and Zosyn. Infectious Disease consultation will be obtained.  RADIOGRAPHS:  AP and lateral radiographs of  the ankle were obtained intraoperatively.  These show interval removal of the left fibula and tibial hardware.  No acute injuries noted, otherwise.     Wylene Simmer, MD     JH/MEDQ  D:  09/28/2014  T:  09/28/2014  Job:  [id number]

## 2014-09-28 NOTE — Transfer of Care (Signed)
Immediate Anesthesia Transfer of Care Note  Patient: Tonya Rodgers  Procedure(s) Performed: Procedure(s): LEFT ANKLE ARTHROTOMY AND DEBRDIEMENT OF TIBIAL OSTEOMYLITIS AND REMOVAL OF DEEP IMPLANTS FROM TIBIA AND FIBIAL AND PLACEMENT OF ANTIBIOTIC BEADS (Left)  Patient Location: PACU  Anesthesia Type:General  Level of Consciousness: awake, alert  and oriented  Airway & Oxygen Therapy: Patient Spontanous Breathing and Patient connected to nasal cannula oxygen  Post-op Assessment: Report given to PACU RN, Post -op Vital signs reviewed and stable and Patient moving all extremities  Post vital signs: Reviewed and stable  Complications: No apparent anesthesia complications regional anesthesia working well.

## 2014-09-29 ENCOUNTER — Encounter (HOSPITAL_COMMUNITY): Payer: Self-pay | Admitting: Orthopedic Surgery

## 2014-09-29 DIAGNOSIS — M86172 Other acute osteomyelitis, left ankle and foot: Secondary | ICD-10-CM

## 2014-09-29 DIAGNOSIS — M86672 Other chronic osteomyelitis, left ankle and foot: Secondary | ICD-10-CM | POA: Diagnosis not present

## 2014-09-29 DIAGNOSIS — M25572 Pain in left ankle and joints of left foot: Secondary | ICD-10-CM

## 2014-09-29 LAB — CBC WITH DIFFERENTIAL/PLATELET
BASOS ABS: 0 10*3/uL (ref 0.0–0.1)
BASOS PCT: 0 % (ref 0–1)
Eosinophils Absolute: 0.1 10*3/uL (ref 0.0–0.7)
Eosinophils Relative: 1 % (ref 0–5)
HCT: 32.5 % — ABNORMAL LOW (ref 36.0–46.0)
Hemoglobin: 10.8 g/dL — ABNORMAL LOW (ref 12.0–15.0)
LYMPHS ABS: 1.1 10*3/uL (ref 0.7–4.0)
Lymphocytes Relative: 21 % (ref 12–46)
MCH: 31.5 pg (ref 26.0–34.0)
MCHC: 33.2 g/dL (ref 30.0–36.0)
MCV: 94.8 fL (ref 78.0–100.0)
Monocytes Absolute: 0.4 10*3/uL (ref 0.1–1.0)
Monocytes Relative: 9 % (ref 3–12)
NEUTROS PCT: 69 % (ref 43–77)
Neutro Abs: 3.5 10*3/uL (ref 1.7–7.7)
Platelets: 231 10*3/uL (ref 150–400)
RBC: 3.43 MIL/uL — AB (ref 3.87–5.11)
RDW: 14.3 % (ref 11.5–15.5)
WBC: 5.1 10*3/uL (ref 4.0–10.5)

## 2014-09-29 LAB — BASIC METABOLIC PANEL
Anion gap: 17 — ABNORMAL HIGH (ref 5–15)
BUN: 11 mg/dL (ref 6–23)
CALCIUM: 9.3 mg/dL (ref 8.4–10.5)
CO2: 23 mEq/L (ref 19–32)
Chloride: 94 mEq/L — ABNORMAL LOW (ref 96–112)
Creatinine, Ser: 0.92 mg/dL (ref 0.50–1.10)
GFR, EST AFRICAN AMERICAN: 74 mL/min — AB (ref >90)
GFR, EST NON AFRICAN AMERICAN: 64 mL/min — AB (ref >90)
GLUCOSE: 110 mg/dL — AB (ref 70–99)
POTASSIUM: 3.6 meq/L — AB (ref 3.7–5.3)
SODIUM: 134 meq/L — AB (ref 137–147)

## 2014-09-29 MED ORDER — ZOLPIDEM TARTRATE 5 MG PO TABS
5.0000 mg | ORAL_TABLET | Freq: Every evening | ORAL | Status: DC | PRN
  Administered 2014-09-29: 5 mg via ORAL
  Filled 2014-09-29: qty 1

## 2014-09-29 MED ORDER — CELECOXIB 200 MG PO CAPS
200.0000 mg | ORAL_CAPSULE | Freq: Two times a day (BID) | ORAL | Status: AC
  Administered 2014-09-29 – 2014-10-01 (×3): 200 mg via ORAL
  Filled 2014-09-29 (×5): qty 1

## 2014-09-29 NOTE — Progress Notes (Signed)
Subjective: 1 Day Post-Op Procedure(s) (LRB): LEFT ANKLE ARTHROTOMY AND DEBRDIEMENT OF TIBIAL OSTEOMYLITIS AND REMOVAL OF DEEP IMPLANTS FROM TIBIA AND FIBIAL AND PLACEMENT OF ANTIBIOTIC BEADS (Left) Patient reports pain as mild.  She reports that her block is still in effect and she cannot feel her LLE. She has not had to take any pain medications yet. She denies any new HA, CP, SOB, N, V, fever, chills, changes in appetite, calf pain or swelling. Her appetite has returned.    Objective: Vital signs in last 24 hours: Temp:  [97 F (36.1 C)-98.2 F (36.8 C)] 97.7 F (36.5 C) (11/20 0629) Pulse Rate:  [33-81] 76 (11/20 0629) Resp:  [12-24] 16 (11/20 0629) BP: (93-160)/(62-97) 134/79 mmHg (11/20 0629) SpO2:  [81 %-100 %] 97 % (11/20 0629) Weight:  [91.899 kg (202 lb 9.6 oz)] 91.899 kg (202 lb 9.6 oz) (11/19 1204)  Intake/Output from previous day: 11/19 0701 - 11/20 0700 In: 2193.3 [P.O.:462; I.V.:1731.3] Out: -  Intake/Output this shift:    No results for input(s): HGB in the last 72 hours. No results for input(s): WBC, RBC, HCT, PLT in the last 72 hours. No results for input(s): NA, K, CL, CO2, BUN, CREATININE, GLUCOSE, CALCIUM in the last 72 hours. No results for input(s): LABPT, INR in the last 72 hours.  Physical Exam: WD/WN Caucasian female in nad. A and O x4. Mood and affect appropriate. EOMI. Respirations normal and unlabored. Abdomen soft and non-tender. LLE in hard splint. Dressings C/D/I. NV intact with brisk capillary refill and 5/5 strength of toe extensors and flexors bilaterally. Distal sensation intact bilaterally. No lymphadenopathy. No calf swelling or palpable cords.    Assessment/Plan: 1 Day Post-Op Procedure(s) (LRB): LEFT ANKLE ARTHROTOMY AND DEBRDIEMENT OF TIBIAL OSTEOMYLITIS AND REMOVAL OF DEEP IMPLANTS FROM TIBIA AND FIBIAL AND PLACEMENT OF ANTIBIOTIC BEADS (Left) -Up with PT, NWB to LLE in hard splint -Continue pain medications prn pain -On lovenox for DVT  prophylaxis  -Currencly on IV vanc and zosyn; ID consult and cultures pending.  Will update tx recs per ID  Tonya Rodgers 09/29/2014, 7:28 AM  [phone]

## 2014-09-29 NOTE — Anesthesia Postprocedure Evaluation (Signed)
  Anesthesia Post-op Note  Patient: Tonya Rodgers  Procedure(s) Performed: Procedure(s): LEFT ANKLE ARTHROTOMY AND DEBRDIEMENT OF TIBIAL OSTEOMYLITIS AND REMOVAL OF DEEP IMPLANTS FROM TIBIA AND FIBIAL AND PLACEMENT OF ANTIBIOTIC BEADS (Left)  Patient Location: PACU  Anesthesia Type:General  Level of Consciousness: awake and alert   Airway and Oxygen Therapy: Patient Spontanous Breathing  Post-op Pain: moderate  Post-op Assessment: Post-op Vital signs reviewed, Patient's Cardiovascular Status Stable, Respiratory Function Stable, Patent Airway, No signs of Nausea or vomiting and Pain level controlled  Post-op Vital Signs: Reviewed and stable  Last Vitals:  Filed Vitals:   09/29/14 1440  BP: 121/71  Pulse: 75  Temp: 36.6 C  Resp: 16    Complications: No apparent anesthesia complications

## 2014-09-29 NOTE — Evaluation (Signed)
Physical Therapy Evaluation Patient Details Name: Tonya Rodgers MRN: [id number] DOB: Feb 13, 1951 Today's Date: 09/29/2014   History of Present Illness  Patient is a 63 y/o female s/p left ankle arthrotomy and debridement of tibial osteomyelitis and removal of deep implants from tib/fib with placement of antibiotic beads. NWB LLE. PMH of HTN, PTSD, depression.    Clinical Impression  Patient presents with functional limitations due to deficits listed in PT problem list (see below). Pt with deconditioning from hospitalization, balance deficits and decreased muscular endurance affecting safe mobility. Instructed pt to try to use RW for short distance ambulation to help maintain strength/mobility of RLE instead of using w/c for all mobility. Compliant with WB throughout all mobility. Pt would benefit from acute PT to improve transfers, gait, endurance and mobility so pt can maximize independence and return to PLOF.    Follow Up Recommendations Home health PT;Supervision for mobility/OOB    Equipment Recommendations  None recommended by PT (pt can borrow RW if needed.)    Recommendations for Other Services       Precautions / Restrictions Precautions Precautions: Fall Required Braces or Orthoses: Other Brace/Splint Other Brace/Splint: Splint on LLE. Restrictions Weight Bearing Restrictions: Yes LLE Weight Bearing: Non weight bearing      Mobility  Bed Mobility Overal bed mobility: Needs Assistance Bed Mobility: Supine to Sit     Supine to sit: Modified independent (Device/Increase time);HOB elevated     General bed mobility comments: Use of rails.  Transfers Overall transfer level: Needs assistance Equipment used: Rolling walker (2 wheeled);None Transfers: Risk manager;Sit to/from Stand Sit to Stand: Min guard Stand pivot transfers: Supervision       General transfer comment: Supervision during SPT bed<->chair, compliant with NWB LLE. Min guard to stand from  chair with RW for safety.  Ambulation/Gait Ambulation/Gait assistance: Min guard Ambulation Distance (Feet): 5 Feet Assistive device: Rolling walker (2 wheeled) Gait Pattern/deviations: Step-to pattern;Decreased stride length;Trunk flexed Gait velocity: slow   General Gait Details: pt able to take a few hops with RW, compliant with WB of LLE however fatigues quickly resulting in need to sit. Min guard for balance/safety.  Stairs            Wheelchair Mobility    Modified Rankin (Stroke Patients Only)       Balance Overall balance assessment: Needs assistance Sitting-balance support: Feet supported;No upper extremity supported Sitting balance-Leahy Scale: Good     Standing balance support: During functional activity Standing balance-Leahy Scale: Fair Standing balance comment: Tolerates SPT with supervision for safety. Requires Min guard during dynamic standing due to fatiguability and use of BUEs for support/balance.                             Pertinent Vitals/Pain Pain Assessment: No/denies pain    Home Living Family/patient expects to be discharged to:: Private residence Living Arrangements: Spouse/significant other Available Help at Discharge: Family;Available 24 hours/day Type of Home: House Home Access: Stairs to enter Entrance Stairs-Rails: Can reach both;Left;Right Entrance Stairs-Number of Steps: 2 Home Layout: One level;Laundry or work area in [employer]: Crutches;Wheelchair - manual;Shower seat - built in;Grab bars - tub/shower (transport chair)      Prior Function Level of Independence: Independent         Comments: Pt does swimming classes at [facility] a few times per week. Does not want to use RW if possible as it gets her tired. Reports she will be using  her transport chair for mobility.     Hand Dominance   Dominant Hand: Right    Extremity/Trunk Assessment   Upper Extremity Assessment: Overall WFL for tasks  assessed           Lower Extremity Assessment: Generalized weakness;LLE deficits/detail;RLE deficits/detail RLE Deficits / Details: Decreased muscular endurance as noted when pt fatigues quickly with hopping. LLE Deficits / Details: Able to perform LAQ without difficulty.      Communication   Communication: No difficulties  Cognition Arousal/Alertness: Awake/alert Behavior During Therapy: WFL for tasks assessed/performed Overall Cognitive Status: Within Functional Limits for tasks assessed                      General Comments General comments (skin integrity, edema, etc.): Edema present in toes on Left. Not able to wiggle toes at this time.    Exercises        Assessment/Plan    PT Assessment Patient needs continued PT services  PT Diagnosis Difficulty walking;Generalized weakness   PT Problem List Decreased strength;Impaired sensation;Decreased activity tolerance;Decreased balance;Decreased mobility  PT Treatment Interventions Balance training;Gait training;Stair training;Patient/family education;Functional mobility training;Therapeutic activities;Therapeutic exercise;Wheelchair mobility training   PT Goals (Current goals can be found in the Care Plan section) Acute Rehab PT Goals Patient Stated Goal: to get better so I can go home PT Goal Formulation: With patient Time For Goal Achievement: 10/13/14 Potential to Achieve Goals: Good    Frequency Min 3X/week   Barriers to discharge Inaccessible home environment Pt has to negotiate 2 steps to enter home    Co-evaluation               End of Session Equipment Utilized During Treatment: Gait belt Activity Tolerance: Patient tolerated treatment well Patient left: in chair;with call bell/phone within reach (informed pt to get let RN know when ready to return to bed.) Nurse Communication: Mobility status;Weight bearing status         Time: 0930-0950 PT Time Calculation (min) (ACUTE ONLY): 20  min   Charges:   PT Evaluation $Initial PT Evaluation Tier I: 1 Procedure PT Treatments $Therapeutic Activity: 8-22 mins   PT G CodesCandy Sledge A 09/29/2014, 10:11 AM Candy Sledge, PT, DPT [phone]
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
## 2014-09-30 ENCOUNTER — Inpatient Hospital Stay (HOSPITAL_COMMUNITY): Payer: Medicare Other

## 2014-09-30 LAB — SEDIMENTATION RATE: SED RATE: 20 mm/h (ref 0–22)

## 2014-09-30 LAB — HEPATITIS C ANTIBODY: HCV Ab: NEGATIVE

## 2014-09-30 LAB — HIV ANTIBODY (ROUTINE TESTING W REFLEX): HIV 1&2 Ab, 4th Generation: NONREACTIVE

## 2014-09-30 LAB — VANCOMYCIN, TROUGH: Vancomycin Tr: 25.6 ug/mL (ref 10.0–20.0)

## 2014-09-30 LAB — C-REACTIVE PROTEIN

## 2014-09-30 MED ORDER — LORATADINE 10 MG PO TABS
10.0000 mg | ORAL_TABLET | Freq: Every day | ORAL | Status: DC
  Administered 2014-10-01 – 2014-10-02 (×2): 10 mg via ORAL
  Filled 2014-09-30 (×2): qty 1

## 2014-09-30 MED ORDER — METOCLOPRAMIDE HCL 5 MG/ML IJ SOLN
5.0000 mg | Freq: Three times a day (TID) | INTRAMUSCULAR | Status: DC | PRN
  Administered 2014-09-30: 5 mg via INTRAVENOUS
  Filled 2014-09-30: qty 2

## 2014-09-30 MED ORDER — VANCOMYCIN HCL IN DEXTROSE 1-5 GM/200ML-% IV SOLN
1000.0000 mg | Freq: Two times a day (BID) | INTRAVENOUS | Status: DC
  Administered 2014-09-30 – 2014-10-01 (×2): 1000 mg via INTRAVENOUS
  Filled 2014-09-30 (×3): qty 200

## 2014-09-30 NOTE — Progress Notes (Signed)
PT Cancellation Note  Patient Details Name: Tonya Rodgers MRN: [id number] DOB: September 01, 1951   Cancelled Treatment:    Reason Eval/Treat Not Completed: Medical issues which prohibited therapy Pt with nausea and vomitting.    Lorriane Shire 09/30/2014, 4:14 PM

## 2014-09-30 NOTE — Progress Notes (Signed)
Pt requests claritin as at home.

## 2014-09-30 NOTE — Progress Notes (Signed)
    [facility] for [facility]ious Disease    Date of Admission:  09/28/2014   Total days of antibiotics 3        Day 3 vanco        Day 3 piptazo           ID: Tonya Rodgers is a 63 y.o. 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 Concern about pain management as she is discharged  Medications:  . celecoxib  200 mg Oral BID  . docusate sodium  100 mg Oral BID  . enoxaparin (LOVENOX) injection  40 mg Subcutaneous Q24H  . hydrochlorothiazide  12.5 mg Oral Daily  . irbesartan  150 mg Oral Daily  . levothyroxine  150 mcg Oral QAC breakfast  . metronidazole  1 application Topical BID  . piperacillin-tazobactam (ZOSYN)  IV  3.375 g Intravenous 3 times per day  . senna  1 tablet Oral BID  . sertraline  50 mg Oral QODAY  . vancomycin  1,250 mg Intravenous Q12H    Objective: Vital signs in last 24 hours: Temp:  [97.5 F (36.4 C)-98.5 F (36.9 C)] 97.5 F (36.4 C) (11/21 4599) Pulse Rate:  [75-76] 76 (11/20 2152) Resp:  [15-18] 18 (11/21 0700) BP: (99-121)/(64-73) 99/64 mmHg (11/21 0642) SpO2:  [94 %-98 %] 94 % (11/21 7741)  Physical Exam  Constitutional:  oriented to person, place, and time. appears well-developed and well-nourished. No distress.  HENT:  Mouth/Throat: Oropharynx is clear and moist. No oropharyngeal exudate.  Cardiovascular: Normal rate, regular rhythm and normal heart sounds. Exam reveals no gallop and no friction rub.  No murmur heard.  Pulmonary/Chest: Effort normal and breath sounds normal. No respiratory distress.  has no wheezes.  Abdominal: Soft. Bowel sounds are normal.  exhibits no distension. There is no tenderness.  Lymphadenopathy: no cervical adenopathy.  Neurological: alert and oriented to person, place, and time.  Ext: left lower leg is wrapped. Moves all toes Skin: Skin is warm and dry.  No rash noted. No erythema.  Psychiatric: a normal mood and affect. behavior is normal.   Lab Results  Recent Labs  09/29/14 2030  WBC 5.1  HGB 10.8*  HCT 32.5*  NA 134*  K 3.6*  CL 94*  CO2 23  BUN 11  CREATININE 0.92   Liver Panel No results for input(s): PROT, ALBUMIN, AST, ALT, ALKPHOS, BILITOT, BILIDIR, IBILI in the last 72 hours. Sedimentation Rate  Recent Labs  09/29/14 2030  ESRSEDRATE 20   C-Reactive Protein  Recent Labs  09/29/14 2030  CRP <0.5*   Path : shows acute and chronic inflammation  Microbiology: 11/19 tissue cx = NGTD  Assessment/Plan: Presumed osteomyelitis of left tiba s/p HW removal from prior ORIF ankle fx  - inflammatory markers and path not completely consistent with acute osteomyeltis, can treat as presumptive chronic osteomyelitis - can do doxycycline 100mg  BID, take after full meal x 6 wks (after meals, to minimize nausea). Can discontinue IV antibiotics, and no need for picc line - would give anti-emetics if has nausea with doxycycline  Tonya Rodgers, [facility] for Infectious Diseases Cell: [phone] Pager: [phone]  09/30/2014, 10:53 AM

## 2014-09-30 NOTE — Progress Notes (Signed)
Benjiman Core, PA given results of KUB.  Orders received.

## 2014-09-30 NOTE — Progress Notes (Signed)
OT Cancellation Note  Patient Details Name: Tonya Rodgers MRN: [id number] DOB: Mar 14, 1951   Cancelled Treatment:    Reason Eval/Treat Not Completed: OT screened. Spoke with pt and she says she has no OT concerns. Will sign off.  Benito Mccreedy OTR/L [phone] 09/30/2014, 5:40 PM

## 2014-09-30 NOTE — Plan of Care (Signed)
Problem: Phase I Progression Outcomes Goal: OOB as tolerated unless otherwise ordered Outcome: Completed/Met Date Met:  09/30/14 Goal: Incision/dressings dry and intact Outcome: Completed/Met Date Met:  09/30/14 Goal: Tubes/drains patent Outcome: Not Applicable Date Met:  85/69/43 Goal: Voiding-avoid urinary catheter unless indicated Outcome: Completed/Met Date Met:  09/30/14 Goal: Vital signs/hemodynamically stable Outcome: Completed/Met Date Met:  09/30/14  Problem: Phase II Progression Outcomes Goal: Progress activity as tolerated unless otherwise ordered Outcome: Completed/Met Date Met:  09/30/14 Goal: Vital signs stable Outcome: Completed/Met Date Met:  09/30/14 Goal: Dressings dry/intact Outcome: Completed/Met Date Met:  09/30/14 Goal: Foley discontinued Outcome: Not Applicable Date Met:  70/05/25 Goal: Tolerating diet Outcome: Completed/Met Date Met:  09/30/14

## 2014-09-30 NOTE — Progress Notes (Signed)
Pt still nauseated and vomiting.  Takes protonix at home prn.  Also, wants something for acid reflux.

## 2014-09-30 NOTE — Progress Notes (Signed)
PA ordered KUB stat to check for ileus.

## 2014-09-30 NOTE — Progress Notes (Signed)
    Subjective: 2 Days Post-Op Procedure(s) (LRB): LEFT ANKLE ARTHROTOMY AND DEBRDIEMENT OF TIBIAL OSTEOMYLITIS AND REMOVAL OF DEEP IMPLANTS FROM TIBIA AND FIBIAL AND PLACEMENT OF ANTIBIOTIC BEADS (Left) Patient reports pain as 3 on 0-10 scale.   Denies CP or SOB.  Voiding without difficulty. Positive flatus. Objective: Vital signs in last 24 hours: Temp:  [97.5 F (36.4 C)-98.5 F (36.9 C)] 98.1 F (36.7 C) (11/21 1130) Pulse Rate:  [75-82] 82 (11/21 1130) Resp:  [15-18] 18 (11/21 1130) BP: (84-121)/(47-73) 84/47 mmHg (11/21 1130) SpO2:  [94 %-98 %] 94 % (11/21 0642)  Intake/Output from previous day: 11/20 0701 - 11/21 0700 In: 2110.8 [P.O.:120; I.V.:1690.8; IV Piggyback:300] Out: -  Intake/Output this shift: Total I/O In: 240 [P.O.:240] Out: 1 [Emesis/NG output:1]  Labs:  Recent Labs  09/29/14 2030  HGB 10.8*    Recent Labs  09/29/14 2030  WBC 5.1  RBC 3.43*  HCT 32.5*  PLT 231    Recent Labs  09/29/14 2030  NA 134*  K 3.6*  CL 94*  CO2 23  BUN 11  CREATININE 0.92  GLUCOSE 110*  CALCIUM 9.3   No results for input(s): LABPT, INR in the last 72 hours.  Physical Exam: Neurologically intact ABD soft Intact pulses distally splint intact  Assessment/Plan: 2 Days Post-Op Procedure(s) (LRB): LEFT ANKLE ARTHROTOMY AND DEBRDIEMENT OF TIBIAL OSTEOMYLITIS AND REMOVAL OF DEEP IMPLANTS FROM TIBIA AND FIBIAL AND PLACEMENT OF ANTIBIOTIC BEADS (Left) Advance diet Up with therapy Continue ABX therapy due to Post-op infection  Meka Lewan D for Dr. Melina Schools [facility] [phone] 09/30/2014, 12:36 PM

## 2014-09-30 NOTE — Progress Notes (Signed)
ANTIBIOTIC CONSULT NOTE - FOLLOW UP  Pharmacy Consult for Vancomycin Indication: osteomyelitis  Allergies  Allergen Reactions  . Keflex [Cephalexin] Hives    Patient Measurements: Height: 5\' 8"  (172.7 cm) Weight: 202 lb 9.6 oz (91.899 kg) IBW/kg (Calculated) : 63.9 Adjusted Body Weight:   Vital Signs: Temp: 98.1 F (36.7 C) (11/21 1130) Temp Source: Oral (11/21 1130) BP: 84/47 mmHg (11/21 1130) Pulse Rate: 82 (11/21 1130) Intake/Output from previous day: 11/20 0701 - 11/21 0700 In: 2110.8 [P.O.:120; I.V.:1690.8; IV Piggyback:300] Out: -  Intake/Output from this shift: Total I/O In: 240 [P.O.:240] Out: 1 [Emesis/NG output:1]  Labs:  Recent Labs  09/29/14 2030  WBC 5.1  HGB 10.8*  PLT 231  CREATININE 0.92   Estimated Creatinine Clearance: 72.3 mL/min (by C-G formula based on Cr of 0.92).  Recent Labs  09/30/14 1132  Ruthven 25.6*     Microbiology: Recent Results (from the past 720 hour(s))  Body fluid culture     Status: None (Preliminary result)   Collection Time: 09/28/14  3:41 PM  Result Value Ref Range Status   Specimen Description SYNOVIAL FLUID ANKLE LEFT  Final   Special Requests NONE  Final   Gram Stain   Final    NO WBC SEEN NO ORGANISMS SEEN Performed at [facility]    Culture PENDING  Incomplete   Report Status PENDING  Incomplete  Anaerobic culture     Status: None (Preliminary result)   Collection Time: 09/28/14  3:41 PM  Result Value Ref Range Status   Specimen Description FLUID SYNOVIAL ANKLE LEFT  Final   Special Requests NONE  Final   Gram Stain   Final    NO WBC SEEN NO ORGANISMS SEEN Performed at [facility]    Culture   Final    NO ANAEROBES ISOLATED; CULTURE IN PROGRESS FOR 5 DAYS Performed at [facility]    Report Status PENDING  Incomplete  Tissue culture     Status: None (Preliminary result)   Collection Time: 09/28/14  3:43 PM  Result Value Ref Range Status   Specimen Description  TISSUE ANKLE LEFT  Final   Special Requests NONE  Final   Gram Stain   Final    NO WBC SEEN NO ORGANISMS SEEN Performed at [facility]    Culture   Final    NO GROWTH 2 DAYS Performed at [facility]    Report Status PENDING  Incomplete  Anaerobic culture     Status: None (Preliminary result)   Collection Time: 09/28/14  3:43 PM  Result Value Ref Range Status   Specimen Description TISSUE ANKLE LEFT  Final   Special Requests NONE  Final   Gram Stain   Final    NO WBC SEEN NO ORGANISMS SEEN Performed at [facility]    Culture   Final    NO ANAEROBES ISOLATED; CULTURE IN PROGRESS FOR 5 DAYS Performed at [facility]    Report Status PENDING  Incomplete    Anti-infectives    Start     Dose/Rate Route Frequency Ordered Stop   09/28/14 2300  vancomycin (VANCOCIN) 1,250 mg in sodium chloride 0.9 % 250 mL IVPB     1,250 mg166.7 mL/hr over 90 Minutes Intravenous Every 12 hours 09/28/14 1842     09/28/14 1845  piperacillin-tazobactam (ZOSYN) IVPB 3.375 g     3.375 g12.5 mL/hr over 240 Minutes Intravenous 3 times per day 09/28/14 1840  09/28/14 1458  vancomycin (VANCOCIN) powder  Status:  Discontinued       As needed 09/28/14 1459 09/28/14 1552   09/28/14 1445  vancomycin (VANCOCIN) IVPB 1000 mg/200 mL premix     1,000 mg200 mL/hr over 60 Minutes Intravenous To Surgery 09/28/14 1440 09/28/14 1609      Assessment: 63yo female with osteomyelitis and post-op infection on Vancomycin 1250mg  IV q12 and Zosyn 3.375g IV q8.  Steady state Vanc trough is 25.6, above goal range.  Cr was < 1 on 11/20.  No issues noted.  Goal of Therapy:  Vancomycin trough level 15-20 mcg/ml  Plan:  Decrease Vancomycin to 1000mg  IV q12, next dose 1700 F/U SS trough  Gracy Bruins, [facility]

## 2014-09-30 NOTE — Progress Notes (Signed)
KUB was negative, Benjiman Core, PA called to give results.

## 2014-10-01 LAB — BODY FLUID CULTURE
CULTURE: NO GROWTH
GRAM STAIN: NONE SEEN

## 2014-10-01 MED ORDER — DOXYCYCLINE HYCLATE 100 MG PO TABS
100.0000 mg | ORAL_TABLET | Freq: Two times a day (BID) | ORAL | Status: DC
  Administered 2014-10-01 – 2014-10-02 (×2): 100 mg via ORAL
  Filled 2014-10-01 (×4): qty 1

## 2014-10-01 MED ORDER — ACETAMINOPHEN 325 MG PO TABS
650.0000 mg | ORAL_TABLET | Freq: Four times a day (QID) | ORAL | Status: DC | PRN
  Administered 2014-10-01 (×2): 650 mg via ORAL
  Filled 2014-10-01 (×2): qty 2

## 2014-10-01 NOTE — Progress Notes (Signed)
PT Cancellation Note  Patient Details Name: Tonya Rodgers MRN: [id number] DOB: April 22, 1951   Cancelled Treatment:    Reason Eval/Treat Not Completed: Patient declined, no reason specified  Pt is supervision for transfers and min guard assist for ambulation with RW.  She reports she plans to use her w/c upon d/c.  This is her 2nd procedure and she feels comfortable with the mobility and stair training education she has previously received.  She does not wish for any further PT intervention at this time.  Requesting d/c from PT caseload.  PT signing off.   Lorriane Shire 10/01/2014, 8:53 AM

## 2014-10-01 NOTE — Progress Notes (Signed)
Subjective: 3 Days Post-Op Procedure(s) (LRB): LEFT ANKLE ARTHROTOMY AND DEBRDIEMENT OF TIBIAL OSTEOMYLITIS AND REMOVAL OF DEEP IMPLANTS FROM TIBIA AND FIBIAL AND PLACEMENT OF ANTIBIOTIC BEADS (Left) Patient reports pain as well controlled.  Reports a good night. Tolerating PO's. Denies CP, SOB, or calf pain. No N/V or F/C.  Objective: Vital signs in last 24 hours: Temp:  [97.9 F (36.6 C)-98.1 F (36.7 C)] 98 F (36.7 C) (11/22 0521) Pulse Rate:  [67-82] 67 (11/22 0521) Resp:  [16-18] 16 (11/22 0521) BP: (84-146)/(47-77) 146/75 mmHg (11/22 0521) SpO2:  [92 %-93 %] 92 % (11/22 0521)  Intake/Output from previous day: 11/21 0701 - 11/22 0700 In: 1417.5 [P.O.:680; I.V.:687.5; IV Piggyback:50] Out: 1 [Emesis/NG output:1] Intake/Output this shift: Total I/O In: 240 [P.O.:240] Out: -    Recent Labs  09/29/14 2030  HGB 10.8*    Recent Labs  09/29/14 2030  WBC 5.1  RBC 3.43*  HCT 32.5*  PLT 231    Recent Labs  09/29/14 2030  NA 134*  K 3.6*  CL 94*  CO2 23  BUN 11  CREATININE 0.92  GLUCOSE 110*  CALCIUM 9.3   No results for input(s): LABPT, INR in the last 72 hours.  Alert and oriented x3. RRR, Lungs clear, BS x4. Left splint C/D/I. No DVT signs. No signs of infection or compartment syndrome. LLE neurovascularly intact. Good capillary refill.    Assessment/Plan: 3 Days Post-Op Procedure(s) (LRB): LEFT ANKLE ARTHROTOMY AND DEBRDIEMENT OF TIBIAL OSTEOMYLITIS AND REMOVAL OF DEEP IMPLANTS FROM TIBIA AND FIBIAL AND PLACEMENT OF ANTIBIOTIC BEADS (Left) Plan D/c Monday Up with PT NWB Left leg Add tylenol for pain control. ID following for ABX management  Jymir Dunaj L 10/01/2014, 10:28 AM

## 2014-10-02 LAB — TISSUE CULTURE
Culture: NO GROWTH
Gram Stain: NONE SEEN

## 2014-10-02 MED ORDER — ZOLPIDEM TARTRATE 5 MG PO TABS: 5.0000 mg | ORAL_TABLET | Freq: Every evening | ORAL | Status: DC | PRN

## 2014-10-02 MED ORDER — SENNA 8.6 MG PO TABS: 1.0000 | ORAL_TABLET | Freq: Two times a day (BID) | ORAL | Status: DC

## 2014-10-02 MED ORDER — DSS 100 MG PO CAPS: 100.0000 mg | ORAL_CAPSULE | Freq: Two times a day (BID) | ORAL | Status: AC

## 2014-10-02 MED ORDER — HYDROCODONE-ACETAMINOPHEN 5-325 MG PO TABS: 1.0000 | ORAL_TABLET | ORAL | Status: DC | PRN

## 2014-10-02 MED ORDER — ONDANSETRON HCL 4 MG PO TABS: 4.0000 mg | ORAL_TABLET | Freq: Four times a day (QID) | ORAL | Status: DC | PRN

## 2014-10-02 MED ORDER — OXYCODONE HCL 5 MG PO TABS: 5.0000 mg | ORAL_TABLET | ORAL | Status: DC | PRN

## 2014-10-02 MED ORDER — DOXYCYCLINE HYCLATE 100 MG PO TABS: 100.0000 mg | ORAL_TABLET | Freq: Two times a day (BID) | ORAL | Status: DC

## 2014-10-02 NOTE — Discharge Instructions (Signed)
Tonya Simmer, MD [place]  Please read the following information regarding your care after surgery.  Medications  You only need a prescription for the narcotic pain medicine (ex. oxycodone, Percocet, Norco).  All of the other medicines listed below are available over the counter. X hydrocdone as prescribed for moderate to severe pain   Narcotic pain medicine (ex. oxycodone, Percocet, Vicodin) will cause constipation.  To prevent this problem, take the following medicines while you are taking any pain medicine. X docusate sodium (Colace) 100 mg twice a day X senna (Senokot) 2 tablets twice a day  X To help prevent blood clots, take an aspirin (325 mg) once a day for a month after surgery.  You should also get up every hour while you are awake to move around.    Weight Bearing X Do not bear any weight on the operated leg or foot.  Cast / Splint / Dressing X Keep your splint or cast clean and dry.  Dont put anything (coat hanger, pencil, etc) down inside of it.  If it gets damp, use a hair dryer on the cool setting to dry it.  If it gets soaked, call the office to schedule an appointment for a cast change.   After your dressing, cast or splint is removed; you may shower, but do not soak or scrub the wound.  Allow the water to run over it, and then gently pat it dry.  Swelling It is normal for you to have swelling where you had surgery.  To reduce swelling and pain, keep your toes above your nose for at least 3 days after surgery.  It may be necessary to keep your foot or leg elevated for several weeks.  If it hurts, it should be elevated.  Follow Up Call my office at [phone] when you are discharged from the hospital or surgery center to schedule an appointment to be seen two weeks after surgery.  Call my office at [phone] if you develop a fever >101.5 F, nausea, vomiting, bleeding from the surgical site or severe pain.

## 2014-10-02 NOTE — Discharge Summary (Signed)
Physician Discharge Summary  Patient ID: Tonya Rodgers MRN: [id number] DOB/AGE: 1951/08/25 63 y.o.  Admit date: 09/28/2014 Discharge date: 10/02/2014  Admission Diagnoses: possible osteomyelitis of L ankle  Discharge Diagnoses:  Active Problems:   Osteomyelitis of ankle or foot, acute   Discharged Condition: good  Hospital Course: Tonya Rodgers presented to [facility] on 11/19 for operative treatment of possible osteomyelitis of Tonya Rodgers L ankle, suspected after a previous open fracture to the same ankle roughly 1 year ago.  Tonya Rodgers underwent hardware removal and had intra-op tissue cultures taken as well. Tonya Rodgers was placed into a hard splint and was made NWB to the LLE post-op.  Tonya Rodgers tolerated the procedure well and had no immediate post-op complications.  Tonya Rodgers was started on IV vanc and zosyn immediately post-op, with ID consult pending.  ID consulted with Dr. Doran Durand. ID felt that it is unlikely acute osteo, but recommended to treat as presumptive chronic osteomyelitis. Cultures (-) to date. IV ABX were d/c and Tonya Rodgers was transitioned to doxy 100mg  BID x 6 weeks.   Tonya Rodgers did well during Tonya Rodgers post-op recovery days, and had minimal complaints. Today, Tonya Rodgers states that Tonya Rodgers pain is well controlled with Tonya Rodgers PO pain medications and that Tonya Rodgers is eager to go home. Tonya Rodgers has been up with PT, and has done well.  Tonya Rodgers has been on loveonx for DVT prophylaxis while inpatient. Patient denies any new HA, CP, SOB, N, V, fever, chills, changes in appetite, calf pain or swelling.  Tonya Rodgers will be d/c today with PO pain meds, ASA 325mg  daily for DVT prophylaxis, and doxy 100mg  BID.     Consults: ID  Significant Diagnostic Studies: microbiology: No growth at 3 days  Treatments: antibiotics: doxycycline 100mg  BID  Discharge Exam: Blood pressure 145/85, pulse 66, temperature 98.1 F (36.7 C), temperature source Oral, resp. rate 18, height 5\' 8"  (1.727 m), weight 91.899 kg (202 lb 9.6 oz), SpO2 97 %.  Physical Exam: WD/WN Caucasian  female in nad. A and O x4. Mood and affect appropriate. EOMI. Respirations normal and unlabored. Abdomen soft and non-tender. LLE in hard splint. Dressings C/D/I. NV intact with brisk capillary refill and 5/5 strength of toe extensors and flexors bilaterally. Distal sensation intact bilaterally. No lymphadenopathy. No calf swelling or palpable cords.   Disposition: 01-Home or Self Care     Medication List    STOP taking these medications        oxyCODONE-acetaminophen 5-325 MG per tablet  Commonly known as:  ROXICET      TAKE these medications        albuterol 108 (90 BASE) MCG/ACT inhaler  Commonly known as:  PROVENTIL HFA;VENTOLIN HFA  Inhale 2 puffs into the lungs every 6 (six) hours as needed for wheezing.     aspirin EC 325 MG tablet  Take 1 tablet (325 mg total) by mouth 2 (two) times daily.     doxycycline 100 MG tablet  Commonly known as:  VIBRA-TABS  Take 1 tablet (100 mg total) by mouth 2 (two) times daily with a meal.     DSS 100 MG Caps  Take 100 mg by mouth 2 (two) times daily.     levothyroxine 150 MCG tablet  Commonly known as:  SYNTHROID, LEVOTHROID  Take 150 mcg by mouth daily before breakfast.     metronidazole 1 % cream  Commonly known as:  NORITATE  Apply 1 application topically 2 (two) times daily. Uses sparingly     ondansetron 4 MG tablet  Commonly  known as:  ZOFRAN  Take 1 tablet (4 mg total) by mouth every 6 (six) hours as needed for nausea.     OVER THE COUNTER MEDICATION  Take 1 tablet by mouth daily. "allergy medication-special order"     oxyCODONE 5 MG immediate release tablet  Commonly known as:  Oxy IR/ROXICODONE  Take 1-2 tablets (5-10 mg total) by mouth every 3 (three) hours as needed for breakthrough pain.     senna 8.6 MG Tabs tablet  Commonly known as:  SENOKOT  Take 1 tablet (8.6 mg total) by mouth 2 (two) times daily.     sertraline 50 MG tablet  Commonly known as:  ZOLOFT  Take 50 mg by mouth every other day.      valsartan-hydrochlorothiazide 160-12.5 MG per tablet  Commonly known as:  DIOVAN-HCT  Take 1 tablet by mouth daily.     zolpidem 5 MG tablet  Commonly known as:  AMBIEN  Take 1 tablet (5 mg total) by mouth at bedtime as needed for sleep.           Follow-up Information    Follow up with Wylene Simmer, MD In 2 weeks.   Specialty:  Orthopedic Surgery   Why:  For wound re-check   Contact information:   [address] [place] [address] [phone]     D/C home  NWB LLE, in hard splint ASA 325mg  daily for DVT prophylaxis PO norco 5-325mg  prn pain--per patient request Doxycycline 100mg  BID with meals x 6 weeks F/U in clinic 2 weeks after sx date  Signed: Zamere Pasternak HOWELLS 10/02/2014, 7:13 AM [phone]

## 2014-10-03 LAB — ANAEROBIC CULTURE
GRAM STAIN: NONE SEEN
GRAM STAIN: NONE SEEN

## 2014-10-14 IMAGING — CR DG ANKLE PORT 2V*L*
1 series · 1 of 1 positions shown · non-contrast
Comparison: Radiography from earlier the same day

CLINICAL DATA: Postreduction.

EXAM:
PORTABLE LEFT ANKLE - 2 VIEW

[AP]
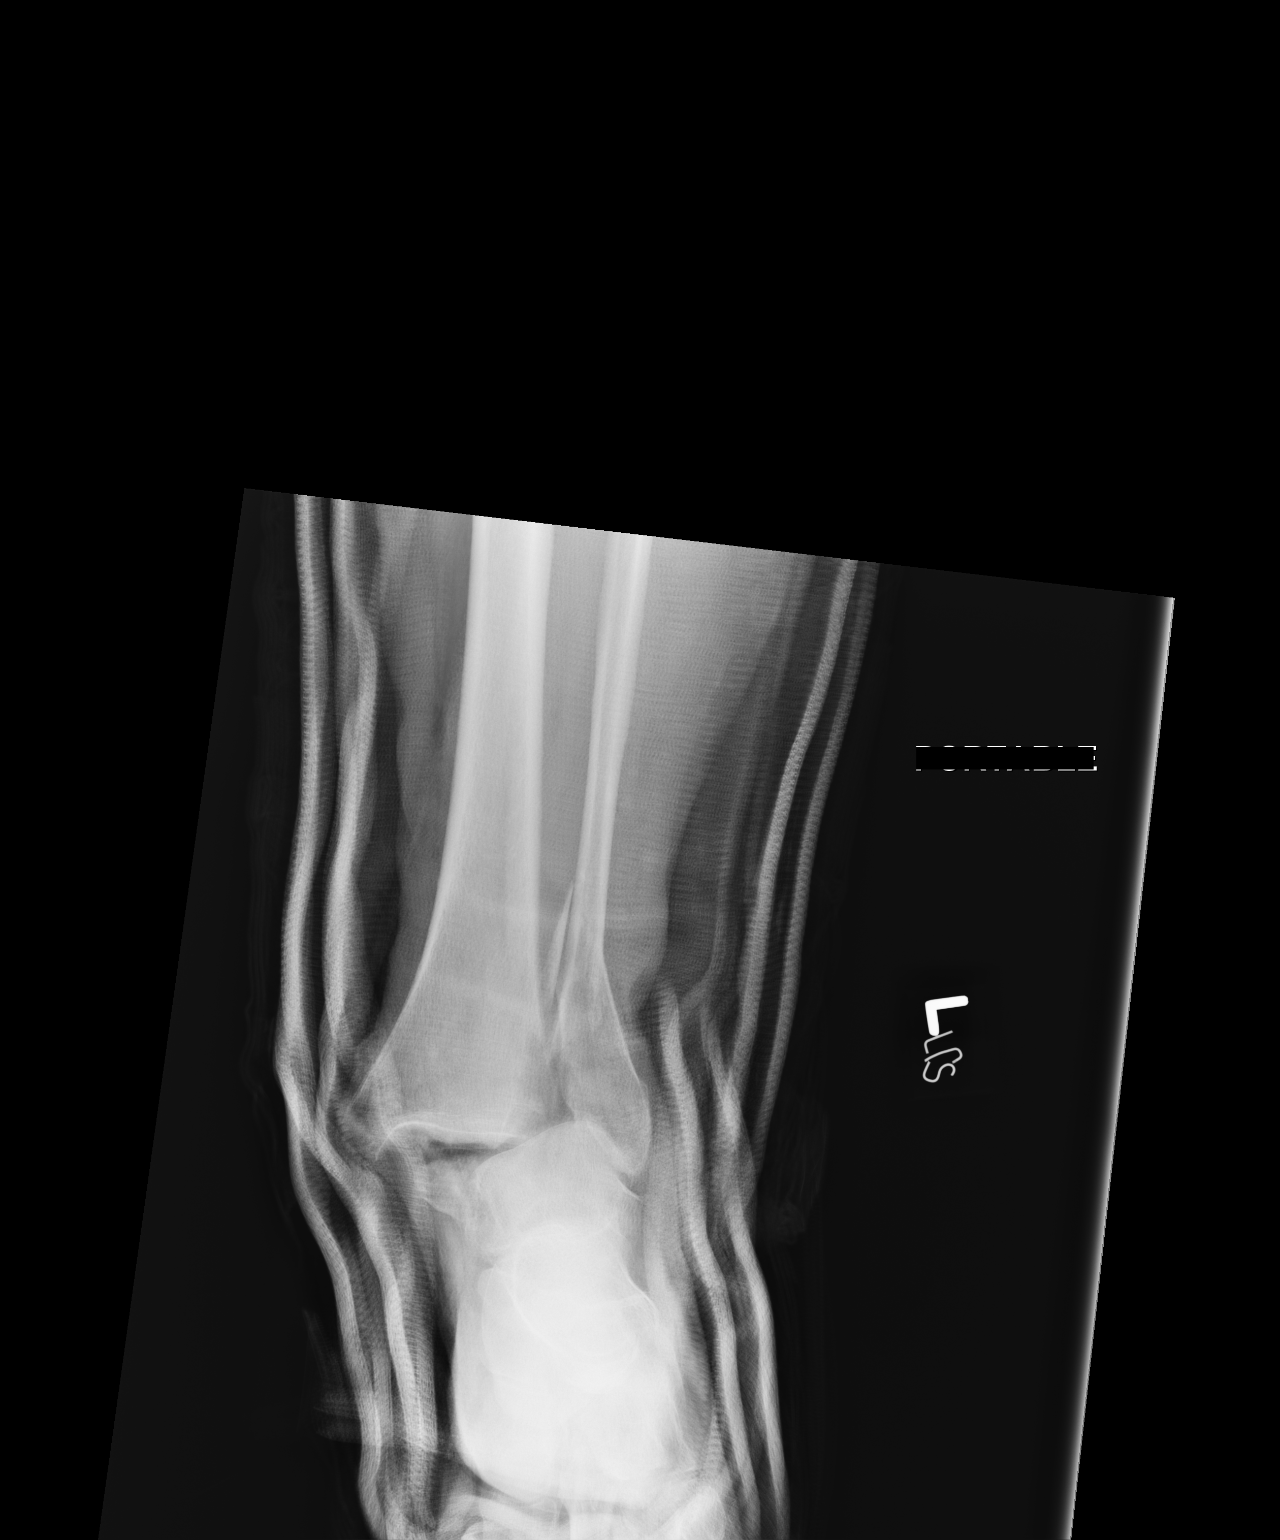

[1 of 1 positions shown; findings below may reference images not displayed]

FINDINGS: Partial relocation of the ankle joint. There is still significant
lateral displacement and subluxation however. The medial malleolus
remains in continuity with the talus. Oblique distal fibular
fracture. A splint has been applied.
IMPRESSION: Improved, but still significantly subluxed, ankle joint alignment
status post open fracture with ankle mortise and distal syndesmotic
disruption.

## 2014-10-26 ENCOUNTER — Ambulatory Visit (INDEPENDENT_AMBULATORY_CARE_PROVIDER_SITE_OTHER): Payer: Federal, State, Local not specified - PPO | Admitting: Internal Medicine

## 2014-10-26 ENCOUNTER — Encounter: Payer: Self-pay | Admitting: Internal Medicine

## 2014-10-26 VITALS — BP 132/83 | HR 93 | Temp 97.4°F | Ht 68.0 in | Wt 198.0 lb

## 2014-10-26 DIAGNOSIS — M86179 Other acute osteomyelitis, unspecified ankle and foot: Secondary | ICD-10-CM

## 2014-10-30 NOTE — Assessment & Plan Note (Signed)
Doing well. Complete course and stop. RTC PRN

## 2014-10-30 NOTE — Progress Notes (Signed)
   Subjective:    Patient ID: Tonya Rodgers, female    DOB: 16-Sep-1951, 63 y.o.   MRN: [id number]  HPI [name] is a 63 y.o. female who sustained a left Open ankle fracture s/p ORIF roughly 1 year ago who stated that she was doing well for the first few months but then had progressive ankle pain and swelling. She was seen as outpatient by Dr. 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 prior to admit, she was temporarily stopped from taking doxycycline for rosacae. She denies any fever, chills, nightsweats, n/v/d. She had some warmth and swelling to left ankle by no erythema or drainage. Now S/P hw removal including removal of deep implants from the left distal tibia and left distal fibula through a separate incision. Left ankle arthrotomy with removal of loose bodies through a separate incision and Left tibial sequestrectomy.   She was started on doxycyline for presumed 6 weeks and is nearly done.  No issues.  Feeling well.    Review of Systems  Constitutional: Negative for fever, chills and fatigue.  Gastrointestinal: Negative for nausea and diarrhea.  Skin: Negative for rash.  Neurological: Negative for dizziness.  Hematological: Negative for adenopathy.       Objective:   Physical Exam  Constitutional: She appears well-developed and well-nourished. No distress.  Eyes: No scleral icterus.  Cardiovascular: Normal rate, regular rhythm and normal heart sounds.   No murmur heard. Pulmonary/Chest: Effort normal and breath sounds normal. No respiratory distress.  Skin: No rash noted.          Assessment & Plan:

## 2015-12-16 IMAGING — CR DG ABD PORTABLE 1V
1 series · 1 of 1 positions shown · non-contrast
Comparison: None.

CLINICAL DATA: Vomiting beginning this morning after breakfast.

EXAM:
PORTABLE ABDOMEN - 1 VIEW

[AP]
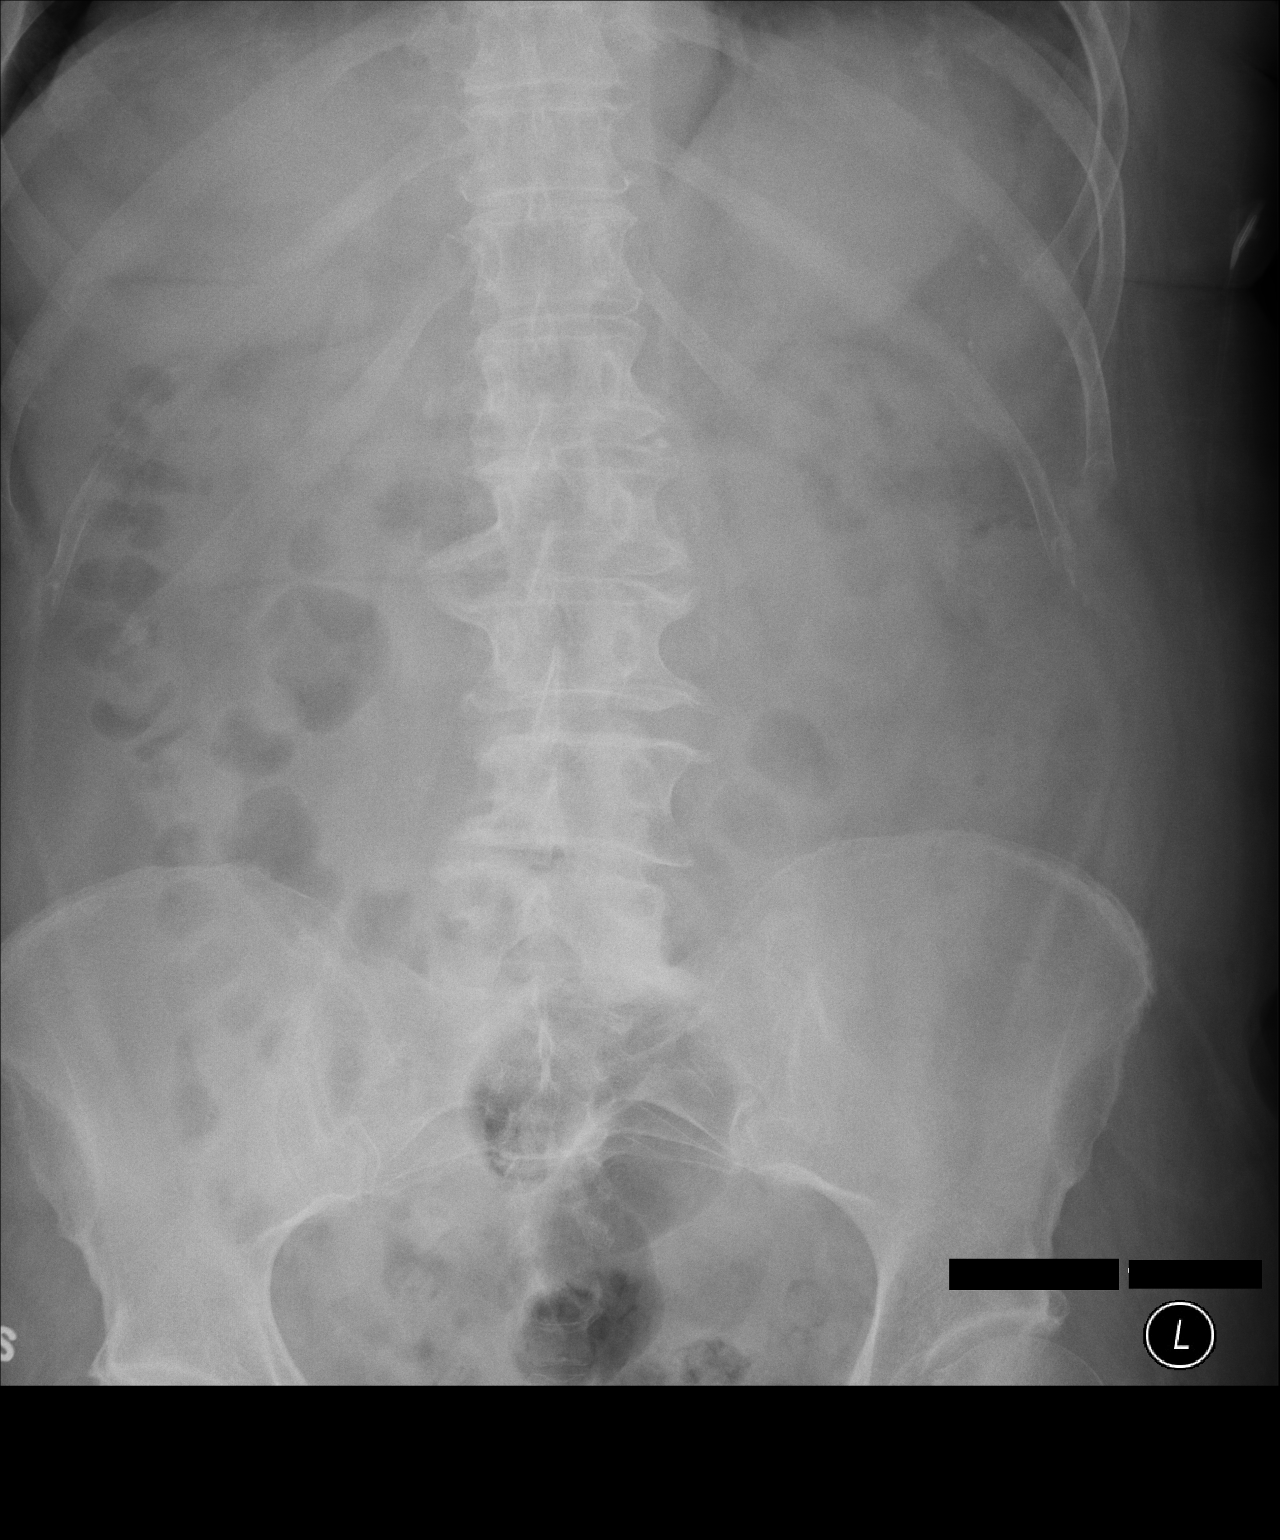

[1 of 1 positions shown; findings below may reference images not displayed]

FINDINGS: The bowel gas pattern is normal. No radio-opaque calculi or other
significant radiographic abnormality are seen.
IMPRESSION: Negative.

## 2016-08-11 ENCOUNTER — Ambulatory Visit (INDEPENDENT_AMBULATORY_CARE_PROVIDER_SITE_OTHER): Payer: Federal, State, Local not specified - PPO | Admitting: Neurology

## 2016-08-11 ENCOUNTER — Encounter: Payer: Self-pay | Admitting: Neurology

## 2016-08-11 ENCOUNTER — Telehealth: Payer: Self-pay | Admitting: Neurology

## 2016-08-11 VITALS — BP 164/97 | HR 93 | Ht 68.0 in | Wt 196.5 lb

## 2016-08-11 DIAGNOSIS — W19XXXA Unspecified fall, initial encounter: Secondary | ICD-10-CM

## 2016-08-11 DIAGNOSIS — R296 Repeated falls: Secondary | ICD-10-CM | POA: Diagnosis not present

## 2016-08-11 DIAGNOSIS — R404 Transient alteration of awareness: Secondary | ICD-10-CM

## 2016-08-11 DIAGNOSIS — R55 Syncope and collapse: Secondary | ICD-10-CM | POA: Diagnosis not present
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
## 2016-08-11 NOTE — Telephone Encounter (Signed)
Dr Ahern- FYI 

## 2016-08-11 NOTE — Patient Instructions (Signed)
As far as diagnostic testing: MRi brain, MRA neck, inpatient EEG monitoring  I would like to see you back in 3 months, sooner if we need to. Please call us with any interim questions, concerns, problems, updates or refill requests.   Our phone number is [phone]. We also have an after hours call service for urgent matters and there is a physician on-call for urgent questions. For any emergencies you know to call 911 or go to the nearest emergency room

## 2016-08-11 NOTE — Telephone Encounter (Signed)
Pt called in stating she does not want to have any testing done at this time. She says thank you and she feels good at this time.

## 2016-08-25 ENCOUNTER — Telehealth: Payer: Self-pay | Admitting: Neurology

## 2016-08-25 NOTE — Telephone Encounter (Signed)
Pt called in requesting MRI orders be sent to [facility]. Please call pt to give update. [phone]

## 2016-08-28 NOTE — Telephone Encounter (Signed)
Patient called to advise, she keeps getting phone calls from [place] to schedule MRI, patient states she had requested this to be done closer to home in [place] and states she hasn't heard from anyone regarding scheduling in [place] area.

## 2016-09-01 NOTE — Telephone Encounter (Signed)
Patient called requesting to speak to someone in MRI's, states she has requested to schedule MRI in [place] but keeps getting calls from [place]. Please call [phone].

## 2016-09-02 NOTE — Telephone Encounter (Signed)
Called the patient to inquire about where she would like to have the MRI in [place]. She did not answer so I left a VM asking her to call me back.

## 2016-09-02 NOTE — Telephone Encounter (Signed)
Patient requested MRI be sent to [facility].

## 2016-09-17 NOTE — Telephone Encounter (Signed)
Just spoke with the patient because I was going to scheduled her MRI to be done at [place]. She stated to me that she already had her MRI and that she had it done Monday at the MRI center in [place]. She got it done through one of her other doctors.  I did apogize to here for getting back to her late.

## 2016-09-17 NOTE — Telephone Encounter (Signed)
If patient already had the MRI via another doctor then cancel ours, she did not make the follow up I requested and she declined EEG and further testing here thanks .

## 2016-09-17 NOTE — Telephone Encounter (Signed)
I called [facility] back and informed them that the patient told me she has already had the MRI.Marland Kitchen They then informed me that she only had a MRI Brain wo and not the Brain w/wo or the MRA Neck w/wo contrast.. How would you like to proceed with this? As of right now she is still scheduled to have both of the MRI that Dr. Jaynee Eagles order at [place] because I have not canceled those yet.Marland Kitchen

## 2016-09-17 NOTE — Telephone Encounter (Signed)
Noted, thank you

## 2016-09-18 NOTE — Telephone Encounter (Signed)
Just spoke with Tonya Rodgers at [facility] & I informed her to go and cancel the MRI's that I made per Dr. Jaynee Eagles due to she had already had an MRI by another Doctor.

## 2016-09-24 DIAGNOSIS — D649 Anemia, unspecified: Secondary | ICD-10-CM

## 2016-09-24 DIAGNOSIS — I951 Orthostatic hypotension: Secondary | ICD-10-CM | POA: Diagnosis not present

## 2016-10-09 DIAGNOSIS — D649 Anemia, unspecified: Secondary | ICD-10-CM | POA: Diagnosis not present

## 2016-10-09 DIAGNOSIS — E039 Hypothyroidism, unspecified: Secondary | ICD-10-CM | POA: Diagnosis not present

## 2016-10-09 DIAGNOSIS — N289 Disorder of kidney and ureter, unspecified: Secondary | ICD-10-CM | POA: Diagnosis not present

## 2016-11-17 ENCOUNTER — Ambulatory Visit: Payer: Federal, State, Local not specified - PPO | Admitting: Neurology

## 2017-01-08 DIAGNOSIS — I951 Orthostatic hypotension: Secondary | ICD-10-CM | POA: Diagnosis not present

## 2017-01-08 DIAGNOSIS — Z862 Personal history of diseases of the blood and blood-forming organs and certain disorders involving the immune mechanism: Secondary | ICD-10-CM | POA: Diagnosis not present

## 2017-09-11 ENCOUNTER — Other Ambulatory Visit: Payer: Self-pay

## 2017-09-11 ENCOUNTER — Emergency Department (HOSPITAL_COMMUNITY): Payer: Federal, State, Local not specified - PPO

## 2017-09-11 ENCOUNTER — Inpatient Hospital Stay (HOSPITAL_COMMUNITY): Payer: Federal, State, Local not specified - PPO

## 2017-09-11 ENCOUNTER — Observation Stay (HOSPITAL_COMMUNITY)
Admission: EM | Admit: 2017-09-11 | Discharge: 2017-09-13 | Disposition: A | Discharge status: Home / Self Care | Payer: Federal, State, Local not specified - PPO | Attending: Family Medicine | Admitting: Family Medicine

## 2017-09-11 ENCOUNTER — Encounter (HOSPITAL_COMMUNITY): Payer: Self-pay

## 2017-09-11 DIAGNOSIS — E871 Hypo-osmolality and hyponatremia: Secondary | ICD-10-CM | POA: Insufficient documentation

## 2017-09-11 DIAGNOSIS — Z85038 Personal history of other malignant neoplasm of large intestine: Secondary | ICD-10-CM

## 2017-09-11 DIAGNOSIS — E039 Hypothyroidism, unspecified: Secondary | ICD-10-CM

## 2017-09-11 DIAGNOSIS — J45909 Unspecified asthma, uncomplicated: Secondary | ICD-10-CM | POA: Insufficient documentation

## 2017-09-11 DIAGNOSIS — N390 Urinary tract infection, site not specified: Secondary | ICD-10-CM | POA: Diagnosis not present

## 2017-09-11 DIAGNOSIS — R4182 Altered mental status, unspecified: Secondary | ICD-10-CM | POA: Diagnosis present

## 2017-09-11 DIAGNOSIS — I951 Orthostatic hypotension: Secondary | ICD-10-CM | POA: Insufficient documentation

## 2017-09-11 DIAGNOSIS — Z883 Allergy status to other anti-infective agents status: Secondary | ICD-10-CM | POA: Diagnosis not present

## 2017-09-11 DIAGNOSIS — R251 Tremor, unspecified: Secondary | ICD-10-CM | POA: Diagnosis not present

## 2017-09-11 DIAGNOSIS — I10 Essential (primary) hypertension: Secondary | ICD-10-CM | POA: Diagnosis not present

## 2017-09-11 DIAGNOSIS — M1990 Unspecified osteoarthritis, unspecified site: Secondary | ICD-10-CM | POA: Diagnosis not present

## 2017-09-11 DIAGNOSIS — C189 Malignant neoplasm of colon, unspecified: Secondary | ICD-10-CM | POA: Diagnosis not present

## 2017-09-11 DIAGNOSIS — Z79899 Other long term (current) drug therapy: Secondary | ICD-10-CM | POA: Diagnosis not present

## 2017-09-11 DIAGNOSIS — F329 Major depressive disorder, single episode, unspecified: Secondary | ICD-10-CM | POA: Diagnosis not present

## 2017-09-11 DIAGNOSIS — C787 Secondary malignant neoplasm of liver and intrahepatic bile duct: Secondary | ICD-10-CM | POA: Insufficient documentation

## 2017-09-11 DIAGNOSIS — R404 Transient alteration of awareness: Secondary | ICD-10-CM | POA: Insufficient documentation

## 2017-09-11 DIAGNOSIS — E43 Unspecified severe protein-calorie malnutrition: Secondary | ICD-10-CM | POA: Insufficient documentation

## 2017-09-11 DIAGNOSIS — E785 Hyperlipidemia, unspecified: Secondary | ICD-10-CM | POA: Diagnosis not present

## 2017-09-11 DIAGNOSIS — F4310 Post-traumatic stress disorder, unspecified: Secondary | ICD-10-CM | POA: Insufficient documentation

## 2017-09-11 DIAGNOSIS — R569 Unspecified convulsions: Secondary | ICD-10-CM | POA: Diagnosis present

## 2017-09-11 DIAGNOSIS — E876 Hypokalemia: Secondary | ICD-10-CM

## 2017-09-11 DIAGNOSIS — R55 Syncope and collapse: Secondary | ICD-10-CM | POA: Diagnosis not present

## 2017-09-11 DIAGNOSIS — K5900 Constipation, unspecified: Secondary | ICD-10-CM | POA: Insufficient documentation

## 2017-09-11 DIAGNOSIS — F32A Depression, unspecified: Secondary | ICD-10-CM

## 2017-09-11 DIAGNOSIS — Z7982 Long term (current) use of aspirin: Secondary | ICD-10-CM | POA: Diagnosis not present

## 2017-09-11 HISTORY — DX: Malignant neoplasm of colon, unspecified: C18.9

## 2017-09-11 LAB — HEPATIC FUNCTION PANEL
ALBUMIN: 2.9 g/dL — AB (ref 3.5–5.0)
ALT: 88 U/L — ABNORMAL HIGH (ref 14–54)
AST: 185 U/L — ABNORMAL HIGH (ref 15–41)
Alkaline Phosphatase: 332 U/L — ABNORMAL HIGH (ref 38–126)
BILIRUBIN DIRECT: 0.7 mg/dL — AB (ref 0.1–0.5)
Indirect Bilirubin: 1 mg/dL — ABNORMAL HIGH (ref 0.3–0.9)
Total Bilirubin: 1.7 mg/dL — ABNORMAL HIGH (ref 0.3–1.2)
Total Protein: 7 g/dL (ref 6.5–8.1)

## 2017-09-11 LAB — BASIC METABOLIC PANEL
Anion gap: 15 (ref 5–15)
BUN: 14 mg/dL (ref 6–20)
CALCIUM: 9.3 mg/dL (ref 8.9–10.3)
CO2: 26 mmol/L (ref 22–32)
Chloride: 88 mmol/L — ABNORMAL LOW (ref 101–111)
Creatinine, Ser: 1.1 mg/dL — ABNORMAL HIGH (ref 0.44–1.00)
GFR calc non Af Amer: 50 mL/min — ABNORMAL LOW (ref >60)
GFR, EST AFRICAN AMERICAN: 58 mL/min — AB (ref >60)
GLUCOSE: 122 mg/dL — AB (ref 65–99)
Potassium: 2.9 mmol/L — ABNORMAL LOW (ref 3.5–5.1)
Sodium: 129 mmol/L — ABNORMAL LOW (ref 135–145)

## 2017-09-11 LAB — URINALYSIS, ROUTINE W REFLEX MICROSCOPIC
Bilirubin Urine: NEGATIVE
Glucose, UA: NEGATIVE mg/dL
Hgb urine dipstick: NEGATIVE
KETONES UR: NEGATIVE mg/dL
Nitrite: POSITIVE — AB
Protein, ur: 100 mg/dL — AB
SPECIFIC GRAVITY, URINE: 1.01 (ref 1.005–1.030)
pH: 6 (ref 5.0–8.0)

## 2017-09-11 LAB — CBC
HCT: 31.4 % — ABNORMAL LOW (ref 36.0–46.0)
Hemoglobin: 10.2 g/dL — ABNORMAL LOW (ref 12.0–15.0)
MCH: 25.2 pg — AB (ref 26.0–34.0)
MCHC: 32.5 g/dL (ref 30.0–36.0)
MCV: 77.5 fL — ABNORMAL LOW (ref 78.0–100.0)
PLATELETS: 452 10*3/uL — AB (ref 150–400)
RBC: 4.05 MIL/uL (ref 3.87–5.11)
RDW: 17.4 % — ABNORMAL HIGH (ref 11.5–15.5)
WBC: 18.2 10*3/uL — ABNORMAL HIGH (ref 4.0–10.5)

## 2017-09-11 LAB — CBG MONITORING, ED: Glucose-Capillary: 88 mg/dL (ref 65–99)

## 2017-09-11 LAB — LACTIC ACID, PLASMA
LACTIC ACID, VENOUS: 2.6 mmol/L — AB (ref 0.5–1.9)
Lactic Acid, Venous: 2.6 mmol/L (ref 0.5–1.9)

## 2017-09-11 LAB — TROPONIN I: Troponin I: <0.03 ng/mL (ref <0.03)

## 2017-09-11 LAB — TSH: TSH: 1.126 u[IU]/mL (ref 0.350–4.500)

## 2017-09-11 LAB — LIPASE, BLOOD: Lipase: 32 U/L (ref 11–51)

## 2017-09-11 MED ORDER — POLYETHYLENE GLYCOL 3350 17 G PO PACK: 17.0000 g | PACK | Freq: Every day | ORAL | Status: DC | PRN

## 2017-09-11 MED ORDER — SODIUM CHLORIDE 0.9 % IV SOLN
INTRAVENOUS | Status: DC
  Administered 2017-09-11 – 2017-09-12 (×3): via INTRAVENOUS

## 2017-09-11 MED ORDER — TRAMADOL HCL 50 MG PO TABS
50.0000 mg | ORAL_TABLET | Freq: Four times a day (QID) | ORAL | Status: DC | PRN
  Administered 2017-09-11 – 2017-09-12 (×3): 50 mg via ORAL
  Filled 2017-09-11 (×3): qty 1

## 2017-09-11 MED ORDER — ALBUTEROL SULFATE (2.5 MG/3ML) 0.083% IN NEBU: 2.5000 mg | INHALATION_SOLUTION | Freq: Four times a day (QID) | RESPIRATORY_TRACT | Status: DC | PRN

## 2017-09-11 MED ORDER — MORPHINE SULFATE (PF) 4 MG/ML IV SOLN
4.0000 mg | Freq: Once | INTRAVENOUS | Status: AC
  Administered 2017-09-11: 4 mg via INTRAVENOUS
  Filled 2017-09-11: qty 1

## 2017-09-11 MED ORDER — NITROFURANTOIN MONOHYD MACRO 100 MG PO CAPS
100.0000 mg | ORAL_CAPSULE | Freq: Two times a day (BID) | ORAL | Status: DC
  Administered 2017-09-11 – 2017-09-13 (×4): 100 mg via ORAL
  Filled 2017-09-11 (×4): qty 1

## 2017-09-11 MED ORDER — SERTRALINE HCL 50 MG PO TABS
50.0000 mg | ORAL_TABLET | ORAL | Status: DC
Start: 2017-09-12 — End: 2017-09-13
  Administered 2017-09-12: 50 mg via ORAL
  Filled 2017-09-11: qty 1

## 2017-09-11 MED ORDER — LEVOTHYROXINE SODIUM 75 MCG PO TABS
150.0000 ug | ORAL_TABLET | Freq: Every day | ORAL | Status: DC
  Administered 2017-09-12 – 2017-09-13 (×2): 150 ug via ORAL
  Filled 2017-09-11 (×2): qty 2

## 2017-09-11 MED ORDER — ACETAMINOPHEN 650 MG RE SUPP: 650.0000 mg | Freq: Four times a day (QID) | RECTAL | Status: DC | PRN

## 2017-09-11 MED ORDER — ACETAMINOPHEN 325 MG PO TABS: 650.0000 mg | ORAL_TABLET | Freq: Four times a day (QID) | ORAL | Status: DC | PRN

## 2017-09-11 MED ORDER — SODIUM CHLORIDE 0.9 % IV BOLUS (SEPSIS)
1000.0000 mL | Freq: Once | INTRAVENOUS | Status: AC
  Administered 2017-09-11: 1000 mL via INTRAVENOUS

## 2017-09-11 MED ORDER — LEVETIRACETAM 500 MG PO TABS
500.0000 mg | ORAL_TABLET | Freq: Two times a day (BID) | ORAL | Status: DC
  Administered 2017-09-11 – 2017-09-13 (×5): 500 mg via ORAL
  Filled 2017-09-11 (×5): qty 1

## 2017-09-11 MED ORDER — ENSURE ENLIVE PO LIQD
237.0000 mL | Freq: Two times a day (BID) | ORAL | Status: DC
  Administered 2017-09-12 – 2017-09-13 (×3): 237 mL via ORAL

## 2017-09-11 MED ORDER — TRAZODONE HCL 50 MG PO TABS: 50.0000 mg | ORAL_TABLET | Freq: Every evening | ORAL | Status: DC | PRN

## 2017-09-11 MED ORDER — POTASSIUM CHLORIDE CRYS ER 20 MEQ PO TBCR
40.0000 meq | EXTENDED_RELEASE_TABLET | Freq: Once | ORAL | Status: AC
  Administered 2017-09-11: 40 meq via ORAL
  Filled 2017-09-11: qty 2

## 2017-09-11 MED ORDER — MIDODRINE HCL 5 MG PO TABS
5.0000 mg | ORAL_TABLET | Freq: Three times a day (TID) | ORAL | Status: DC
Start: 2017-09-12 — End: 2017-09-13
  Administered 2017-09-12 – 2017-09-13 (×4): 5 mg via ORAL
  Filled 2017-09-11 (×4): qty 1

## 2017-09-11 MED ORDER — ONDANSETRON HCL 4 MG PO TABS: 4.0000 mg | ORAL_TABLET | Freq: Four times a day (QID) | ORAL | Status: DC | PRN

## 2017-09-11 MED ORDER — ONDANSETRON HCL 4 MG/2ML IJ SOLN: 4.0000 mg | Freq: Four times a day (QID) | INTRAMUSCULAR | Status: DC | PRN

## 2017-09-11 MED ORDER — SODIUM CHLORIDE 0.9 % IV BOLUS (SEPSIS): 500.0000 mL | Freq: Once | INTRAVENOUS | Status: DC

## 2017-09-11 MED ORDER — ENOXAPARIN SODIUM 40 MG/0.4ML ~~LOC~~ SOLN
40.0000 mg | SUBCUTANEOUS | Status: DC
  Administered 2017-09-11 – 2017-09-12 (×2): 40 mg via SUBCUTANEOUS
  Filled 2017-09-11 (×2): qty 0.4

## 2017-09-11 MED ORDER — POTASSIUM CHLORIDE 10 MEQ/100ML IV SOLN
10.0000 meq | Freq: Once | INTRAVENOUS | Status: AC
  Administered 2017-09-11: 10 meq via INTRAVENOUS
  Filled 2017-09-11: qty 100

## 2017-09-11 NOTE — ED Notes (Signed)
Pt returned from EEG at this time.  Food tray set-up at bedside
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
## 2017-09-11 NOTE — ED Triage Notes (Signed)
Pt arrives POV, was sent by PCP due to syncope episodes (3-4 today) and hypotension. BP at doctors office 80/44. BP here currently is 126/77. She was at a follow up appt regarding a CT and [place] and was just diagnosed was stage four liver and colon cancer. She reports feeling dizzy, weak and pain to abdomen and shoulders. A&OX4.

## 2017-09-11 NOTE — Progress Notes (Signed)
EEG completed; results pending.    

## 2017-09-11 NOTE — ED Notes (Signed)
Attempted Report at this time.  It was reported to me that the bed assigned was not a clean room.  Awaiting further notice.

## 2017-09-11 NOTE — ED Notes (Signed)
CRITICAL VALUE ALERT  Critical Value:  Lactic acid 2.6  Date & Time Notied:  09/11/17 12:23  Provider Notified: Francia Greaves, MD
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
## 2017-09-11 NOTE — ED Notes (Signed)
Attempted to call report at this time.  RN with another patient, will call this RN back.

## 2017-09-11 NOTE — Progress Notes (Signed)
FMTS Attending Admission Note: Annabell Sabal MD Personal pager:  [phone] FPTS Service Pager:  [phone]  I  have seen and examined this patient, reviewed their chart. I have discussed this patient with the resident. I agree with the resident's findings, assessment and care plan.  Additionally:  66 year old female presenting with leg shaking and staring spell at her PCPs office today.  Patient was at her primary care physician's office.  She has had a recent diagnosis of metastatic colon cancer.  She was in the examination room when she had a staring episode and her legs began to shake.  Husband states she slid over in her seat but never fell and hit the floor.  She does not member this.  No postictal state/confusion.  No incontinence.  No tongue biting.  Witnessed by husband was not present on my examination.  Brought to the emergency department.  There is some concern for possible syncope and she was admitted to family practice teaching service.  On my exam she is awake and alert.  Doing well without complaints except for some chronic abdominal pain in her epigastrium.  Heart is regular in rhythm.  Lungs are clear.  Neuro exam is completely intact except for some left facial drooping which is chronic since surgery in 2010 on her face/skull.  Impression/plan: 1.  Neuro: -I agree this is possible seizure versus pseudoseizure.  Does not seem to be global hypoperfusion of the brain/syncope.  She was being taken for an EEG after my examination. -She has history of this in the past.  She has had an EEG which was negative couple years ago. -Her most recent episode of similar staring spell and leg shaking was earlier this week [place].  She states is about 9 months before that. -Neurology has been consulted  -She just received some difficult news that might have triggered this but again she has a history of the same for the past several years.  2.  Orthostatic  hypotension: -She is on home midodrine. -Again I do not think this is a global hypoperfusion issue but syncope can occasionally cause tonic-clonic movements.  If she became orthostatic while seated this might have contributed.  #3.  Hyponatremia: -Baseline is low 130s and she is 129.  4.  Metastatic colon cancer: -This may be at least contributing to 1 above.  5.  Disposition: -She has been admitted and is in observation status.  I will sign the resident notes once completed.  Alveda Reasons, MD 09/11/2017 4:53 PM

## 2017-09-11 NOTE — ED Notes (Signed)
Patient transported to X-ray 

## 2017-09-11 NOTE — Procedures (Signed)
Date 09/11/17  Referring physician Mervyn Gay  Reason for the study syncope  Technical Digital EEG recording using 10-20 international Electrode system  Description of the recording Posterior dominant rhythm 8-9Hz  bilateral with reactivity Sleep architecture was not seen Epileptiform activity was not seen during this recording  Impression The EEG is normal in awake state only.

## 2017-09-11 NOTE — ED Notes (Signed)
Portable xray at bedside.

## 2017-09-11 NOTE — ED Notes (Signed)
Attempted to call report at this time.  No answer on the unit

## 2017-09-12 DIAGNOSIS — R4182 Altered mental status, unspecified: Secondary | ICD-10-CM

## 2017-09-12 DIAGNOSIS — R251 Tremor, unspecified: Secondary | ICD-10-CM

## 2017-09-12 DIAGNOSIS — C189 Malignant neoplasm of colon, unspecified: Secondary | ICD-10-CM

## 2017-09-12 DIAGNOSIS — E039 Hypothyroidism, unspecified: Secondary | ICD-10-CM

## 2017-09-12 DIAGNOSIS — E876 Hypokalemia: Secondary | ICD-10-CM

## 2017-09-12 DIAGNOSIS — F32A Depression, unspecified: Secondary | ICD-10-CM

## 2017-09-12 DIAGNOSIS — F329 Major depressive disorder, single episode, unspecified: Secondary | ICD-10-CM

## 2017-09-12 LAB — CBC
HCT: 26.9 % — ABNORMAL LOW (ref 36.0–46.0)
Hemoglobin: 8.6 g/dL — ABNORMAL LOW (ref 12.0–15.0)
MCH: 24.6 pg — ABNORMAL LOW (ref 26.0–34.0)
MCHC: 32 g/dL (ref 30.0–36.0)
MCV: 77.1 fL — AB (ref 78.0–100.0)
PLATELETS: 370 10*3/uL (ref 150–400)
RBC: 3.49 MIL/uL — AB (ref 3.87–5.11)
RDW: 17.5 % — ABNORMAL HIGH (ref 11.5–15.5)
WBC: 13.8 10*3/uL — AB (ref 4.0–10.5)

## 2017-09-12 LAB — BASIC METABOLIC PANEL
ANION GAP: 10 (ref 5–15)
BUN: 8 mg/dL (ref 6–20)
CALCIUM: 8.3 mg/dL — AB (ref 8.9–10.3)
CO2: 26 mmol/L (ref 22–32)
CREATININE: 0.78 mg/dL (ref 0.44–1.00)
Chloride: 93 mmol/L — ABNORMAL LOW (ref 101–111)
GFR calc Af Amer: >60 mL/min (ref >60)
GFR calc non Af Amer: >60 mL/min (ref >60)
Glucose, Bld: 89 mg/dL (ref 65–99)
POTASSIUM: 3.3 mmol/L — AB (ref 3.5–5.1)
SODIUM: 129 mmol/L — AB (ref 135–145)

## 2017-09-12 MED ORDER — KETOROLAC TROMETHAMINE 30 MG/ML IJ SOLN
15.0000 mg | Freq: Once | INTRAMUSCULAR | Status: AC
  Administered 2017-09-12: 15 mg via INTRAVENOUS
  Filled 2017-09-12: qty 1

## 2017-09-12 MED ORDER — POTASSIUM CHLORIDE CRYS ER 20 MEQ PO TBCR
20.0000 meq | EXTENDED_RELEASE_TABLET | Freq: Once | ORAL | Status: AC
  Administered 2017-09-12: 20 meq via ORAL
  Filled 2017-09-12: qty 1

## 2017-09-12 MED ORDER — MORPHINE SULFATE (PF) 2 MG/ML IV SOLN
2.0000 mg | INTRAVENOUS | Status: DC | PRN
  Administered 2017-09-13: 2 mg via INTRAVENOUS
  Filled 2017-09-12: qty 1

## 2017-09-12 NOTE — Progress Notes (Signed)
Initial Nutrition Assessment  DOCUMENTATION CODES:   Severe malnutrition in context of chronic illness  INTERVENTION:  1. Ensure Enlive po BID, each supplement provides 350 kcal and 20 grams of protein 2. Recommend liberalize diet  NUTRITION DIAGNOSIS:   Severe Malnutrition related to chronic illness as evidenced by percent weight loss, severe fat depletion, severe muscle depletion.  GOAL:   Patient will meet greater than or equal to 90% of their needs  MONITOR:   PO intake, I & O's, Labs, Supplement acceptance, Weight trends  REASON FOR ASSESSMENT:   Malnutrition Screening Tool    ASSESSMENT:   Tonya Rodgers is a 66 y.o. female presenting with c/o "passing out". PMH is significant for hypothyroidism, HTN, arthritis, depression, PTSD, asthma, rosacea, anemia, left ear deafness. Recent Metastatic Colon Cancer dx.   Spoke with Tonya Rodgers at bedside. She reports 55-60 pound weight loss over the past 6 months now hovering around 122-127 pounds, severe for timeframe. Normally eats frosted shredded wheat for breakfast or a mcdonalds bacon, egg, and cheese sandwich. Will eat a cooked meal for dinner, something like spaghetti with meat and sides. Patient struggles to eat sufficient calories, and if she forces herself to eat she vomits. In significant pain right now but receiving medication. Ate some beef tips and green beans with lunch - 45% meal completion per chart. Complains of food lacking salt. rec liberalize diet. Encouraged her to eat as much as she can and continue to consume ensure when she discharges. Wants to receive cancer treatment in [place].  Labs reviewed:  Na 129, K 3.3, TBili 1.7,   Medications reviewed and include:  20K+ NS at 148mL/hr   NUTRITION - FOCUSED PHYSICAL EXAM:    Most Recent Value  Orbital Region  Severe depletion  Upper Arm Region  Severe depletion  Thoracic and Lumbar Region  Severe depletion  Buccal Region  Severe depletion  Temple Region   Severe depletion  Clavicle Bone Region  Severe depletion  Clavicle and Acromion Bone Region  Severe depletion  Scapular Bone Region  Severe depletion  Dorsal Hand  Severe depletion  Patellar Region  Severe depletion  Anterior Thigh Region  Severe depletion  Posterior Calf Region  Severe depletion  Edema (RD Assessment)  None  Hair  Reviewed  Eyes  Reviewed  Mouth  Reviewed  Skin  Reviewed  Nails  Reviewed       Diet Order:  Diet Heart Room service appropriate? Yes; Fluid consistency: Thin  EDUCATION NEEDS:   Education needs have been addressed  Skin:  Skin Assessment: Reviewed RN Assessment  Last BM:  09/10/2017  Height:   Ht Readings from Last 1 Encounters:  09/12/17 5\' 8"  (1.727 m)    Weight:   Wt Readings from Last 1 Encounters:  08/11/16 196 lb 8 oz (89.1 kg)    Ideal Body Weight:  63.63 kg  BMI:  18.74 - 19.5 kg/m2 based on stated weight of 122-127 pounds  Estimated Nutritional Needs:   Kcal:  1900-2200 calories  Protein:  115-130 grams  Fluid:  1.9-2.2L   Tonya Anis. Taelar Gronewold, MS, RD LDN Inpatient Clinical Dietitian Pager [phone]

## 2017-09-12 NOTE — Progress Notes (Signed)
Family Medicine Teaching Service Daily Progress Note Intern Pager: [phone]  Patient name: Tonya Rodgers Medical record number: [id number] Date of birth: Oct 24, 1951 Age: 66 y.o. Gender: female  Primary Care Provider: Ocie Doyne., MD Consultants: Neurology Code Status: Full   Pt Overview and Major Events to Date:  11/2: Patient admitted for leg shaking/starting spell concerning for seizure vs. Pseudoseizure   Assessment and Plan: [name] is a 66 y.o. female presenting with c/o "passing out". PMH is significant for hypothyroidism, HTN, arthritis, depression, PTSD, asthma, rosacea, anemia, left ear deafness, colon cancer with liver mets.  Seizure vs pseudoseizure-  Patient with hx of posterior parietal craniotomy gives structural reason for seizures. There was intitial concern for mass effect or brain mets from colon cancer but CT head does not show mets/hemmorage or mass effect. Hx of orthostatic hypotension was considered as she had missed some doses of mididrone lately and episode was described as "passing out". Patient has seen outside Cardiology and Neurology and not thought to have cardiogenic syncope or seizures with prior work-up including: "EEG, neurology consultation remote and recent, brain MRI, MRA neck, lab testing including negative workup for adrenal insufficiency, orthostatic BPs, echocardiogram, cardiology." EEG performed and negative while patient was awake.  -Discussed patient with on-call Neurologist -keppra 500BID, no loading dose needed per neuro -will need to see neuro outpatient unless repeat of potential seizure -seizure precautions -cardiac monitoring  Orthostatic hypotension-Orthostatics positive with 30 mm Hg drop in systolic blood pressure and 30 mm Hg drop in diastolic from sitting to standing at 3 minutes.  -home midodrine 5mg  TID   Elevated lactic acid: 2.6, if episode was true seizure this could be more muscle hypoxia.   It could be cancer related  given mets and white count.   Patient appears non-toxic and is afebrile but with elevated white count and urine findings, infection is also possibility -will begin abx for UTI  UTI- confirmed by abdominal/pelvic pain and UA -urine culture ordered -Macrobid 100 mg BID (10/2- )  Colon cancer w/ liver mets- Diagnosed this week by CT. Reports 55 lb weight loss in last 6 months. -Outpatient f/u in [place] with prior heme/onc  Hyponatremia- 129, baseline for years is low 30s. No longer drinking EtOH, is on SSRI. -no specific intervention unless she becomes symptomatic  Hypokalemia-Improved to 3.3 today.  -20 kdur   Hypothyroidism- chronic -home 150 synthroid  Asthma- describes as only used occasionally -home albuterol prn -will not prescribe montulekast as she said she wasn't taking it  Constipation- described as chronic, did have BM 11/1, takes ducosate sodium at home -miralax  Hyperlipidemia- prescribed statin- not taking -will ask consent to restart  Pain:tylenol prn  FEN/GI: regular diet/zofran prn Prophylaxis: lovenox   Disposition: Likely home today   Subjective:  Complains of pain in her neck. No further episodes of staring or shaking since admission.   Objective: Temp:  [98.1 F (36.7 C)-100 F (37.8 C)] 100 F (37.8 C) (11/03 0647) Pulse Rate:  [72-99] 85 (11/03 0647) Resp:  [16-26] 18 (11/03 0647) BP: (106-179)/(77-112) 157/87 (11/03 0647) SpO2:  [90 %-100 %] 99 % (11/03 0647) Physical Exam: General: female lying in bed in NAD  Cardiovascular: RRR. No murmurs appreciated.  Respiratory: CTAB. No wheezes or crackles.  Extremities: Moves all extremities spontaneously.  Neuro: A&Ox3. Normal Speech. CN II-XII grossly intact. Strength 5/5 in all extremities.   Laboratory:  Recent Labs Lab 09/11/17 0958 09/12/17 0614  WBC 18.2* 13.8*  HGB 10.2* 8.6*  HCT  31.4* 26.9*  PLT 452* 370    Recent Labs Lab 09/11/17 0958 09/11/17 1111  09/12/17 0614  NA 129*  --  129*  K 2.9*  --  3.3*  CL 88*  --  93*  CO2 26  --  26  BUN 14  --  8  CREATININE 1.10*  --  0.78  CALCIUM 9.3  --  8.3*  PROT  --  7.0  --   BILITOT  --  1.7*  --   ALKPHOS  --  332*  --   ALT  --  88*  --   AST  --  185*  --   GLUCOSE 122*  --  89   Imaging/Diagnostic Tests: Ct Head Wo Contrast  Result Date: 09/11/2017 CLINICAL DATA:  Syncope with questionable seizure. Metastatic colon carcinoma. EXAM: CT HEAD WITHOUT CONTRAST TECHNIQUE: Contiguous axial images were obtained from the base of the skull through the vertex without intravenous contrast. COMPARISON:  Head CT July 29, 2013; brain MRI September 15, 2016 FINDINGS: Brain: There is age related volume loss. The patient has had a previous suboccipital craniotomy. There is a calcified lesion located slightly medial to the left cerebellopontine angle measuring 2.3 x 1.4 cm, stable. There is no surrounding edema. There is no localized mass effect in this area. No similar lesions are noted elsewhere. No other evidence suggesting mass elsewhere. There is no hemorrhage. No extra-axial fluid collection or midline shift. There is minimal periventricular small vessel disease in the centra semiovale bilaterally. No evident acute infarct. Vascular: No hyperdense vessels are evident. There are foci of calcification in each carotid siphon region. There is also calcification in the distal left and right vertebral arteries. Skull: Bony calvarium appears intact except for postoperative change in the left inferolateral parietal bone. There is a benign exostosis, stable, arising from the medial left occipital bone measuring 1.1 x 0.7 cm. Sinuses/Orbits: There is mucosal thickening in several ethmoid air cells. Other visualized paranasal sinuses are clear. There is mild leftward deviation of the nasal septum. Orbits appear symmetric bilaterally. Other: Mastoid air cells on the left are clear. There is opacification in several  inferior mastoid air cells on the right, a finding present on prior MR. IMPRESSION: 1. Calcified lesion along the medial aspect of the left cerebellopontine angle, stable. No surrounding edema. Question calcified meningioma versus postoperative scarring with calcification. This area appears benign. 2. Age related volume loss with slight periventricular small vessel disease. No intracranial hemorrhage or edema. No midline shift or extra-axial fluid collection. No acute infarct. 3. Prior left occipital craniotomy. Benign exostosis medial left occipital bone. No blastic or lytic appearing bone lesions. 4. Mucosal thickening in several ethmoid air cells. Mild nasal septal deviation toward the left. 5.  Chronic right-sided mastoid air cell disease. Electronically Signed   By: Lowella Grip III M.D.   On: 09/11/2017 11:55   Dg Chest Port 1 View  Result Date: 09/11/2017 CLINICAL DATA:  Syncopal episodes. EXAM: PORTABLE CHEST 1 VIEW COMPARISON:  06/17/2016 FINDINGS: Heart size is normal. There is aortic atherosclerosis. The pulmonary vascularity is normal. Lungs are clear. No effusions. No acute bone findings. Chronic degenerative changes of the shoulders with loose body on the left. IMPRESSION: No active disease. Electronically Signed   By: Nelson Chimes M.D.   On: 09/11/2017 12:03    Nicolette Bang, DO 09/12/2017, 9:40 AM PGY-3, [facility] Intern pager: [phone], text pages welcome

## 2017-09-13 DIAGNOSIS — R4182 Altered mental status, unspecified: Secondary | ICD-10-CM | POA: Diagnosis not present

## 2017-09-13 DIAGNOSIS — R251 Tremor, unspecified: Secondary | ICD-10-CM | POA: Diagnosis not present

## 2017-09-13 LAB — BASIC METABOLIC PANEL
Anion gap: 10 (ref 5–15)
BUN: 7 mg/dL (ref 6–20)
CALCIUM: 8.2 mg/dL — AB (ref 8.9–10.3)
CO2: 25 mmol/L (ref 22–32)
CREATININE: 0.78 mg/dL (ref 0.44–1.00)
Chloride: 93 mmol/L — ABNORMAL LOW (ref 101–111)
GFR calc Af Amer: >60 mL/min (ref >60)
Glucose, Bld: 81 mg/dL (ref 65–99)
POTASSIUM: 3.2 mmol/L — AB (ref 3.5–5.1)
SODIUM: 128 mmol/L — AB (ref 135–145)

## 2017-09-13 LAB — CBC
HCT: 25.2 % — ABNORMAL LOW (ref 36.0–46.0)
Hemoglobin: 8.1 g/dL — ABNORMAL LOW (ref 12.0–15.0)
MCH: 24.9 pg — AB (ref 26.0–34.0)
MCHC: 32.1 g/dL (ref 30.0–36.0)
MCV: 77.5 fL — ABNORMAL LOW (ref 78.0–100.0)
PLATELETS: 353 10*3/uL (ref 150–400)
RBC: 3.25 MIL/uL — AB (ref 3.87–5.11)
RDW: 17.5 % — ABNORMAL HIGH (ref 11.5–15.5)
WBC: 12.1 10*3/uL — ABNORMAL HIGH (ref 4.0–10.5)

## 2017-09-13 MED ORDER — NITROFURANTOIN MONOHYD MACRO 100 MG PO CAPS: 100.0000 mg | ORAL_CAPSULE | Freq: Two times a day (BID) | ORAL | 0 refills | Status: AC

## 2017-09-13 MED ORDER — ENSURE ENLIVE PO LIQD: 237.0000 mL | Freq: Two times a day (BID) | ORAL | 12 refills | Status: AC

## 2017-09-13 MED ORDER — OXYCODONE HCL 5 MG PO TABS: 5.0000 mg | ORAL_TABLET | Freq: Four times a day (QID) | ORAL | 0 refills | Status: AC | PRN

## 2017-09-13 MED ORDER — POLYETHYLENE GLYCOL 3350 17 G PO PACK: 17.0000 g | PACK | Freq: Every day | ORAL | 0 refills | Status: AC | PRN

## 2017-09-13 MED ORDER — POTASSIUM CHLORIDE CRYS ER 20 MEQ PO TBCR
40.0000 meq | EXTENDED_RELEASE_TABLET | ORAL | Status: DC
  Administered 2017-09-13 (×2): 40 meq via ORAL
  Filled 2017-09-13 (×2): qty 2

## 2017-09-13 MED ORDER — LEVETIRACETAM 500 MG PO TABS: 500.0000 mg | ORAL_TABLET | Freq: Two times a day (BID) | ORAL | 0 refills | Status: AC

## 2017-09-13 NOTE — Progress Notes (Signed)
Family Medicine Teaching Service Daily Progress Note Intern Pager: [phone]  Patient name: Tonya Rodgers Medical record number: [id number] Date of birth: Jan 23, 1951 Age: 66 y.o. Gender: female  Primary Care Provider: Ocie Doyne., MD Consultants: Neurology Code Status: Full   Pt Overview and Major Events to Date:  11/2: Patient admitted for leg shaking/starting spell concerning for seizure vs. Pseudoseizure   Assessment and Plan: Mry Lamia is a 66 y.o. female presenting with c/o "passing out". PMH is significant for hypothyroidism, HTN, arthritis, depression, PTSD, asthma, rosacea, anemia, left ear deafness, colon cancer with liver mets.  Seizure vs pseudoseizure-  Patient with hx of posterior parietal craniotomy gives structural reason for seizures. There was intitial concern for mass effect or brain mets from colon cancer but CT head does not show mets/hemmorage or mass effect. Hx of orthostatic hypotension was considered as she had missed some doses of mididrone lately and episode was described as "passing out". Patient has seen outside Cardiology and Neurology and not thought to have cardiogenic syncope or seizures with prior work-up including: "EEG, neurology consultation remote and recent, brain MRI, MRA neck, lab testing including negative workup for adrenal insufficiency, orthostatic BPs, echocardiogram, cardiology." EEG performed and negative while patient was awake.  -Discussed patient with on-call Neurologist -keppra 500BID, no loading dose needed per neuro -will need to see neuro outpatient unless repeat of potential seizure -seizure precautions -cardiac monitoring  Orthostatic hypotension-Orthostatics positive with 30 mm Hg drop in systolic blood pressure and 30 mm Hg drop in diastolic from sitting to standing at 3 minutes.  -home midodrine 5mg  TID  Elevated lactic acid: 2.6, if episode was true seizure this could be more muscle hypoxia.   It could be cancer related  given mets and white count.   Patient appears non-toxic and is afebrile but with elevated white count and urine findings, infection is also possibility -will begin abx for UTI  UTI- confirmed by abdominal/pelvic pain and UA -urine culture ordered -Macrobid 100 mg BID (10/2- )  Colon cancer w/ liver mets- Diagnosed this week by CT. Reports 55 lb weight loss in last 6 months. -Outpatient f/u in [place] with prior heme/onc -morphine 2mg  q2prn, will calculate to oral meds on d/c  Hyponatremia- 129, baseline for years is low 30s. No longer drinking EtOH, is on SSRI. -no specific intervention unless she becomes symptomatic  Hypokalemia-Improved to 3.3 today.  -20 kdur   Hypothyroidism- chronic -home 150 synthroid  Asthma- describes as only used occasionally -home albuterol prn -will not prescribe montulekast as she said she wasn't taking it  Constipation- described as chronic, did have BM 11/1, takes ducosate sodium at home -miralax  Hyperlipidemia- prescribed statin- not taking -will ask consent to restart  Pain:tylenol prn  FEN/GI: regular diet/zofran prn Prophylaxis: lovenox   Disposition: Likely home today   Subjective:  - feels better today.  Ready to go home  Objective: Temp:  [98.7 F (37.1 C)-100 F (37.8 C)] 99.9 F (37.7 C) (11/03 2100) Pulse Rate:  [74-85] 79 (11/03 2100) Resp:  [16-20] 20 (11/03 2100) BP: (134-157)/(87-95) 146/95 (11/03 2100) SpO2:  [97 %-100 %] 97 % (11/03 2100) Physical Exam: General: female lying in bed in NAD  Cardiovascular: RRR. No murmurs appreciated.  Respiratory: CTAB. No wheezes or crackles.  Extremities: Moves all extremities spontaneously.  Neuro: A&Ox3. Normal Speech. CN II-XII grossly intact. Strength 5/5 in all extremities.   Laboratory:  Recent Labs Lab 09/11/17 0958 09/12/17 0614  WBC 18.2* 13.8*  HGB 10.2* 8.6*  HCT 31.4* 26.9*  PLT 452* 370    Recent Labs Lab 09/11/17 0958 09/11/17 1111  09/12/17 0614  NA 129*  --  129*  K 2.9*  --  3.3*  CL 88*  --  93*  CO2 26  --  26  BUN 14  --  8  CREATININE 1.10*  --  0.78  CALCIUM 9.3  --  8.3*  PROT  --  7.0  --   BILITOT  --  1.7*  --   ALKPHOS  --  332*  --   ALT  --  88*  --   AST  --  185*  --   GLUCOSE 122*  --  89   Imaging/Diagnostic Tests: Ct Head Wo Contrast  Result Date: 09/11/2017 CLINICAL DATA:  Syncope with questionable seizure. Metastatic colon carcinoma. EXAM: CT HEAD WITHOUT CONTRAST TECHNIQUE: Contiguous axial images were obtained from the base of the skull through the vertex without intravenous contrast. COMPARISON:  Head CT July 29, 2013; brain MRI September 15, 2016 FINDINGS: Brain: There is age related volume loss. The patient has had a previous suboccipital craniotomy. There is a calcified lesion located slightly medial to the left cerebellopontine angle measuring 2.3 x 1.4 cm, stable. There is no surrounding edema. There is no localized mass effect in this area. No similar lesions are noted elsewhere. No other evidence suggesting mass elsewhere. There is no hemorrhage. No extra-axial fluid collection or midline shift. There is minimal periventricular small vessel disease in the centra semiovale bilaterally. No evident acute infarct. Vascular: No hyperdense vessels are evident. There are foci of calcification in each carotid siphon region. There is also calcification in the distal left and right vertebral arteries. Skull: Bony calvarium appears intact except for postoperative change in the left inferolateral parietal bone. There is a benign exostosis, stable, arising from the medial left occipital bone measuring 1.1 x 0.7 cm. Sinuses/Orbits: There is mucosal thickening in several ethmoid air cells. Other visualized paranasal sinuses are clear. There is mild leftward deviation of the nasal septum. Orbits appear symmetric bilaterally. Other: Mastoid air cells on the left are clear. There is opacification in several  inferior mastoid air cells on the right, a finding present on prior MR. IMPRESSION: 1. Calcified lesion along the medial aspect of the left cerebellopontine angle, stable. No surrounding edema. Question calcified meningioma versus postoperative scarring with calcification. This area appears benign. 2. Age related volume loss with slight periventricular small vessel disease. No intracranial hemorrhage or edema. No midline shift or extra-axial fluid collection. No acute infarct. 3. Prior left occipital craniotomy. Benign exostosis medial left occipital bone. No blastic or lytic appearing bone lesions. 4. Mucosal thickening in several ethmoid air cells. Mild nasal septal deviation toward the left. 5.  Chronic right-sided mastoid air cell disease. Electronically Signed   By: Lowella Grip III M.D.   On: 09/11/2017 11:55   Dg Chest Port 1 View  Result Date: 09/11/2017 CLINICAL DATA:  Syncopal episodes. EXAM: PORTABLE CHEST 1 VIEW COMPARISON:  06/17/2016 FINDINGS: Heart size is normal. There is aortic atherosclerosis. The pulmonary vascularity is normal. Lungs are clear. No effusions. No acute bone findings. Chronic degenerative changes of the shoulders with loose body on the left. IMPRESSION: No active disease. Electronically Signed   By: Nelson Chimes M.D.   On: 09/11/2017 12:03    Sherene Sires, DO 09/13/2017, 1:14 AM PGY-3, [place] Intern pager: [phone], text pages welcome  ------------------------------------- FMTS Attending Daily Note:  Annabell Sabal MD  [phone] pager  Family Practice pager:  [phone] I have seen and examined this patient and have reviewed their chart. I have discussed this patient with the resident. I agree with the resident's findings, assessment and care plan.  Additionally: **Late note:  Patient examined at 10:30 AM - Doing well today.  Only concerns are some burning on the roof of her mouth that started yesterday.  - No further shaking or staring  spells since being started on Keppra. - She has FU to be scheduled with her oncologist.  We have alerted their office about her hospitalization. - Able to DC home today.  - For sore: treat with magic mouthwash.  Scattered lesions on hard palate directly behind front teeth.  6-8 small pinpoint ulcers.  NO other lesions throughout mouth.  No skin lesions.  She denies any genitourinary lesions.  I do NOT think this is a mucocutaneous drug eruption.  Looks mostly like a collection of aphthous ulcers.  Will treat as such with magic mouthwash, warning precautions provided about rash or other eruptions.    Alveda Reasons, MD 09/13/2017 2:28 PM

## 2017-09-13 NOTE — Progress Notes (Signed)
Helane Rima to be D/C'd Home per MD order.  Discussed with the patient and all questions fully answered.  VSS, Skin clean, dry and intact without evidence of skin break down, no evidence of skin tears noted. IV catheter discontinued intact. Site without signs and symptoms of complications. Dressing and pressure applied.  An After Visit Summary was printed and given to the patient. Patient received prescription.  D/c education completed with patient/family including follow up instructions, medication list, d/c activities limitations if indicated, with other d/c instructions as indicated by MD - patient able to verbalize understanding, all questions fully answered.   Patient instructed to return to ED, call 911, or call MD for any changes in condition.   Patient escorted via [place], and D/C home via private auto.  Dorris Carnes 09/13/2017 8:53 PM

## 2017-09-13 NOTE — Discharge Summary (Signed)
[facility] Discharge Summary  Patient name: Tonya Rodgers Medical record number: [id number] Date of birth: 07-May-1951 Age: 66 y.o. Gender: female Date of Admission: 09/11/2017  Date of Discharge: 09/13/17 Admitting Physician: Alveda Reasons, MD  Primary Care Provider: Ocie Doyne., MD Consultants: neuro  Indication for Hospitalization: seizure vs psuedoseizure in patient with prior craniotomy  Discharge Diagnoses/Problem List:  colon cancer with liver METS Seizure vs psuedoseizure Orthostatic hypotension UTI Asthma Constipation hyperlipidemia  Disposition: to home  Discharge Condition: stable  Discharge Exam: General: female lying in bed in NAD  Cardiovascular: RRR. No murmurs appreciated.  Respiratory: CTAB. No wheezes or crackles.  Extremities: Moves all extremities spontaneously.  Neuro: A&Ox3. Normal Speech.  Sensory motor grossly intact.    Brief Hospital Course:  Patient was admitted for concerning event of syncope vs pseudoseizure vs seizure at doctor's office.  Neurology was consulted and recommened EEG with starting keppra and outpatient f/u.  EEG was neg so she did not require inpatient neurology.  Patient also had recent diagnosis colon cancer with liver mets so CT head was ordered and was neg for new masses or acute bleed.  Patient will need to f/u with oncologist in [place] that she knows from prior anemia workup.  Neuro exams were normal while inpatient with no repeat episodes and patient was determined safe for d/c with close f/u by heme/onc and neurology.  Patient was instructed to actively manage getting and appt but we also called those offices (sunday) to let them know she needed to see them.  Issues for Follow Up:  1. Patient had episdoe concerning for seizure vs pseudoseizure, EEG neg, will need outpatient neurology followup.  Started on keppra 500BID/  [place] neurology has been told she has a referal, please  coordinate 2. Patient has new diagnosis colon cancer with mets to liver, intends to establish care with prior hematologist, Dr. Bobby Rumpf in [place]. 3. Patient was treated for pain 2mg  morphine q2prn but was not requesting this med regularly, she was discharged on 11/4.  She was prescribed roxy 5mg  for 14 days only and will need pain management through either primary or heme-onc.  We recommend avoiding tramadol due to lowered seizure threshold. 4. Patient states she was not taking statin, please consider restarting.  Significant Procedures: EEG (normal), CT (negative for acute process)  Significant Labs and Imaging:  Recent Labs  Lab 09/11/17 0958 09/12/17 0614 09/13/17 0518  WBC 18.2* 13.8* 12.1*  HGB 10.2* 8.6* 8.1*  HCT 31.4* 26.9* 25.2*  PLT 452* 370 353   Recent Labs  Lab 09/11/17 0958 09/11/17 1111 09/12/17 0614 09/13/17 0518  NA 129*  --  129* 128*  K 2.9*  --  3.3* 3.2*  CL 88*  --  93* 93*  CO2 26  --  26 25  GLUCOSE 122*  --  89 81  BUN 14  --  8 7  CREATININE 1.10*  --  0.78 0.78  CALCIUM 9.3  --  8.3* 8.2*  ALKPHOS  --  332*  --   --   AST  --  185*  --   --   ALT  --  88*  --   --   ALBUMIN  --  2.9*  --   --     Ct Head Wo Contrast  Result Date: 09/11/2017 CLINICAL DATA:  Syncope with questionable seizure. Metastatic colon carcinoma. EXAM: CT HEAD WITHOUT CONTRAST TECHNIQUE: Contiguous axial images were obtained from the base of the  skull through the vertex without intravenous contrast. COMPARISON:  Head CT July 29, 2013; brain MRI September 15, 2016 FINDINGS: Brain: There is age related volume loss. The patient has had a previous suboccipital craniotomy. There is a calcified lesion located slightly medial to the left cerebellopontine angle measuring 2.3 x 1.4 cm, stable. There is no surrounding edema. There is no localized mass effect in this area. No similar lesions are noted elsewhere. No other evidence suggesting mass elsewhere. There is no hemorrhage. No  extra-axial fluid collection or midline shift. There is minimal periventricular small vessel disease in the centra semiovale bilaterally. No evident acute infarct. Vascular: No hyperdense vessels are evident. There are foci of calcification in each carotid siphon region. There is also calcification in the distal left and right vertebral arteries. Skull: Bony calvarium appears intact except for postoperative change in the left inferolateral parietal bone. There is a benign exostosis, stable, arising from the medial left occipital bone measuring 1.1 x 0.7 cm. Sinuses/Orbits: There is mucosal thickening in several ethmoid air cells. Other visualized paranasal sinuses are clear. There is mild leftward deviation of the nasal septum. Orbits appear symmetric bilaterally. Other: Mastoid air cells on the left are clear. There is opacification in several inferior mastoid air cells on the right, a finding present on prior MR. IMPRESSION: 1. Calcified lesion along the medial aspect of the left cerebellopontine angle, stable. No surrounding edema. Question calcified meningioma versus postoperative scarring with calcification. This area appears benign. 2. Age related volume loss with slight periventricular small vessel disease. No intracranial hemorrhage or edema. No midline shift or extra-axial fluid collection. No acute infarct. 3. Prior left occipital craniotomy. Benign exostosis medial left occipital bone. No blastic or lytic appearing bone lesions. 4. Mucosal thickening in several ethmoid air cells. Mild nasal septal deviation toward the left. 5.  Chronic right-sided mastoid air cell disease. Electronically Signed   By: Lowella Grip III M.D.   On: 09/11/2017 11:55   Dg Chest Port 1 View  Result Date: 09/11/2017 CLINICAL DATA:  Syncopal episodes. EXAM: PORTABLE CHEST 1 VIEW COMPARISON:  06/17/2016 FINDINGS: Heart size is normal. There is aortic atherosclerosis. The pulmonary vascularity is normal. Lungs are clear. No  effusions. No acute bone findings. Chronic degenerative changes of the shoulders with loose body on the left. IMPRESSION: No active disease. Electronically Signed   By: Nelson Chimes M.D.   On: 09/11/2017 12:03    Results/Tests Pending at Time of Discharge:   Discharge Medications:  Allergies as of 09/13/2017      Reactions   Keflex [cephalexin] Hives      Medication List    STOP taking these medications   aspirin EC 325 MG tablet   ondansetron 4 MG tablet Commonly known as:  ZOFRAN     TAKE these medications   acetaminophen 500 MG tablet Commonly known as:  TYLENOL Take 1,000 mg by mouth every 6 (six) hours as needed for moderate pain.   albuterol 108 (90 Base) MCG/ACT inhaler Commonly known as:  PROVENTIL HFA;VENTOLIN HFA Inhale 2 puffs into the lungs every 6 (six) hours as needed for wheezing.   DSS 100 MG Caps Take 100 mg by mouth 2 (two) times daily.   feeding supplement (ENSURE ENLIVE) Liqd Take 237 mLs 2 (two) times daily between meals by mouth.   levETIRAcetam 500 MG tablet Commonly known as:  KEPPRA Take 1 tablet (500 mg total) 2 (two) times daily by mouth.   levothyroxine 150 MCG tablet Commonly known  as:  SYNTHROID, LEVOTHROID Take 150 mcg by mouth daily before breakfast.   metronidazole 1 % cream Commonly known as:  NORITATE Apply 1 application topically 2 (two) times daily. Uses sparingly   midodrine 5 MG tablet Commonly known as:  PROAMATINE Take 5 mg by mouth 3 (three) times daily.   montelukast 10 MG tablet Commonly known as:  SINGULAIR Take 10 mg by mouth daily.   nitrofurantoin (macrocrystal-monohydrate) 100 MG capsule Commonly known as:  MACROBID Take 1 capsule (100 mg total) every 12 (twelve) hours for 5 days by mouth.   OVER THE COUNTER MEDICATION Take 1 tablet by mouth daily. "allergy medication-special order"   oxyCODONE 5 MG immediate release tablet Commonly known as:  ROXICODONE Take 1 tablet (5 mg total) every 6 (six) hours as  needed for up to 14 days by mouth.   polyethylene glycol packet Commonly known as:  MIRALAX / GLYCOLAX Take 17 g daily as needed by mouth for mild constipation.   pravastatin 40 MG tablet Commonly known as:  PRAVACHOL Take 40 mg by mouth daily.   sertraline 50 MG tablet Commonly known as:  ZOLOFT Take 50 mg by mouth every other day.       Discharge Instructions: Please refer to Patient Instructions section of EMR for full details.  Patient was counseled important signs and symptoms that should prompt return to medical care, changes in medications, dietary instructions, activity restrictions, and follow up appointments.   Follow-Up Appointments: Follow-up Information    [facility] [place]. Schedule an appointment as soon as possible for a visit.   Specialty:  Neurology Contact information: [address], [address] [place] [address] [phone]       Marice Potter, MD. Schedule an appointment as soon as possible for a visit.   Specialty:  Oncology Contact information: [address]. [place] [address] [phone]        Ocie Doyne., MD. Schedule an appointment as soon as possible for a visit.   Specialty:  Family Medicine Contact information: [place] [address] [place], [place], DO 09/13/2017, 8:17 PM PGY-1, [place]

## 2017-09-15 DIAGNOSIS — C787 Secondary malignant neoplasm of liver and intrahepatic bile duct: Secondary | ICD-10-CM | POA: Diagnosis not present

## 2017-09-15 DIAGNOSIS — C189 Malignant neoplasm of colon, unspecified: Secondary | ICD-10-CM | POA: Diagnosis not present

## 2017-11-10 DEATH — deceased

## 2018-11-27 IMAGING — CT CT HEAD W/O CM
4 series · 15 of 47 positions shown, 17 images · non-contrast
Comparison: Head CT July 29, 2013; brain MRI September 15, 2016

CLINICAL DATA: Syncope with questionable seizure. Metastatic colon
carcinoma.

EXAM:
CT HEAD WITHOUT CONTRAST
TECHNIQUE: Contiguous axial images were obtained from the base of the skull
through the vertex without intravenous contrast.

[Series 3: head without · axial · non-contrast · 0.42mm/px · z∈[-108,+7]mm · 7 of 31 slices shown, 9 images]
[im 4/31  brain]
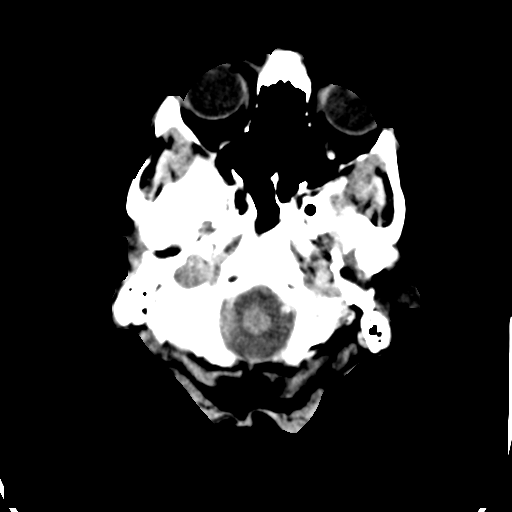
[im 4/31  bone]
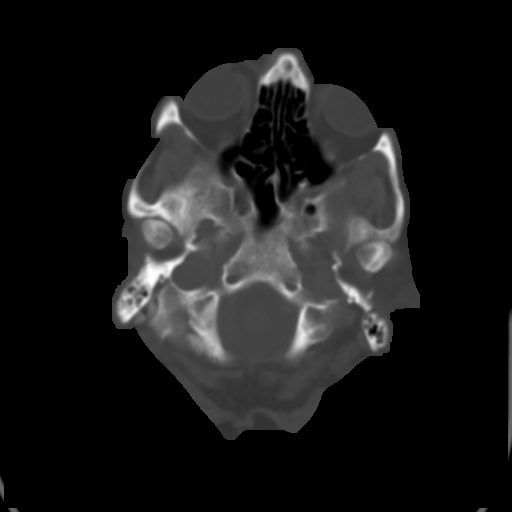
[im 8/31  brain]
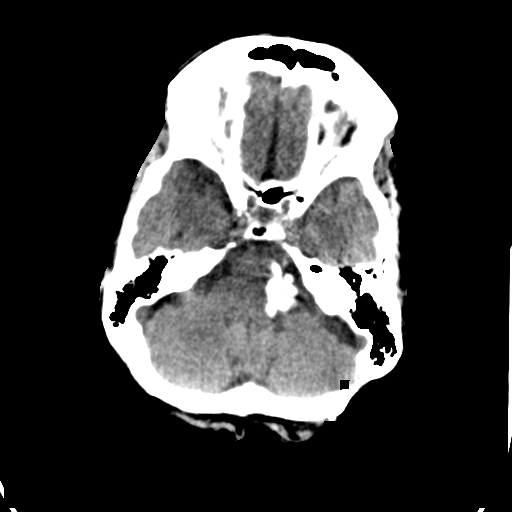
[im 12/31  brain]
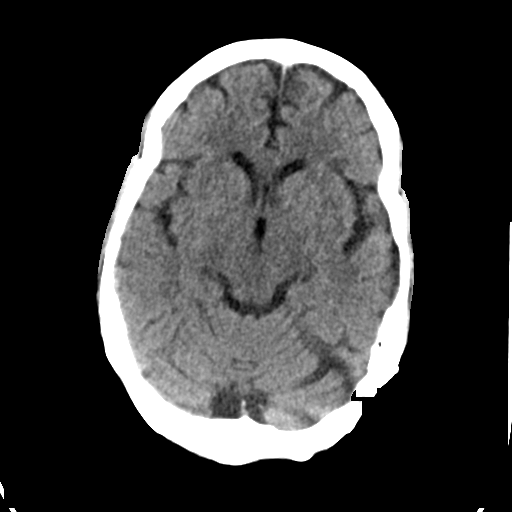
[im 16/31  brain]
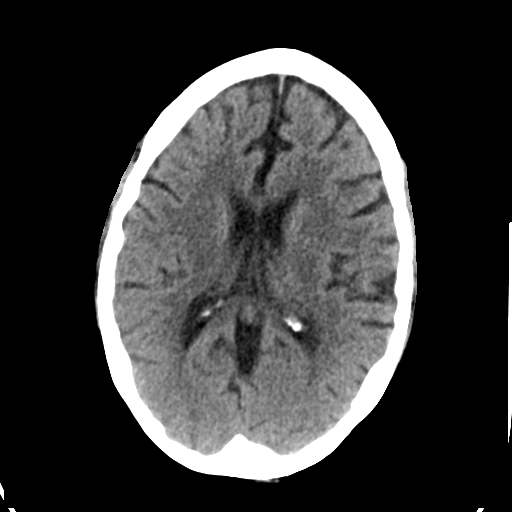
[im 19/31  brain]
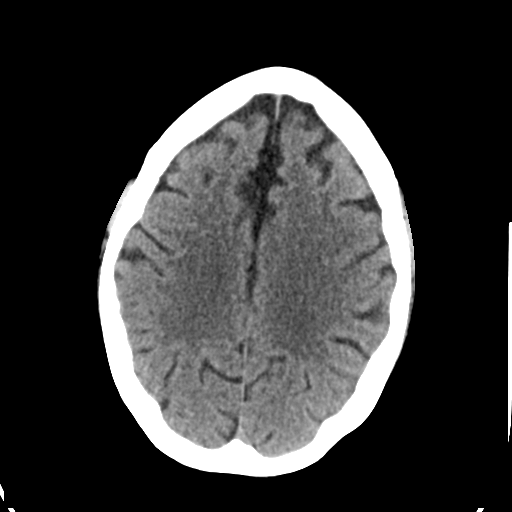
[im 19/31  bone]
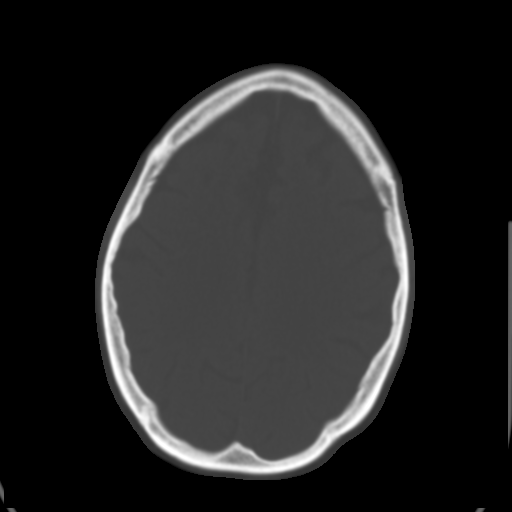
[im 23/31  brain]
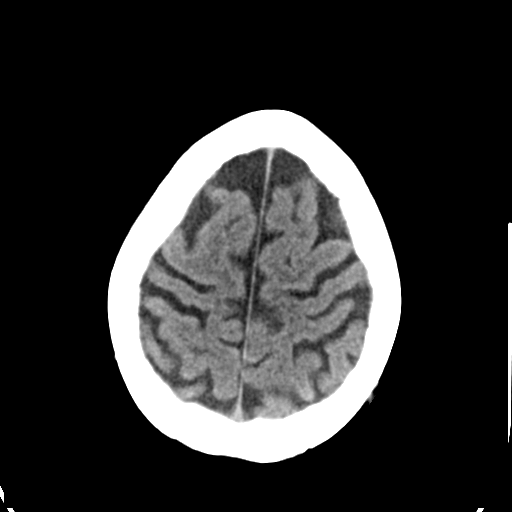
[im 27/31  brain]
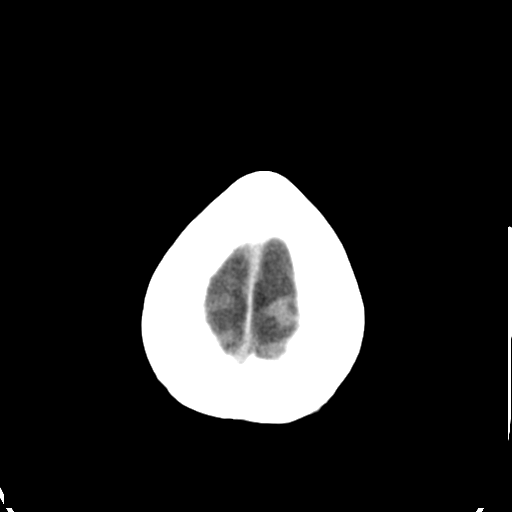

[Series 4: head bone · axial · 0.42mm/px · z∈[-109,-93]mm · 2 of 77 slices shown]
[im 8/77  bone]
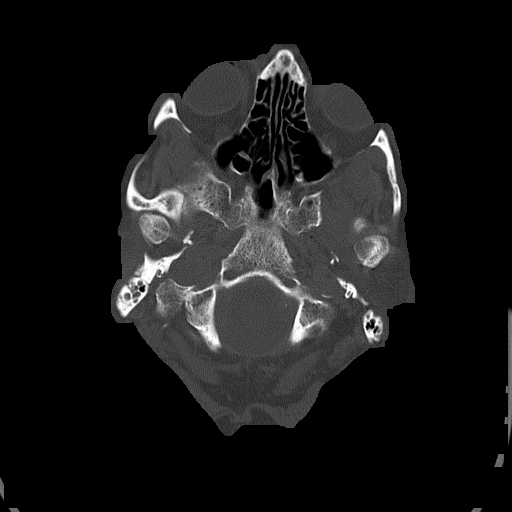
[im 16/77  bone]
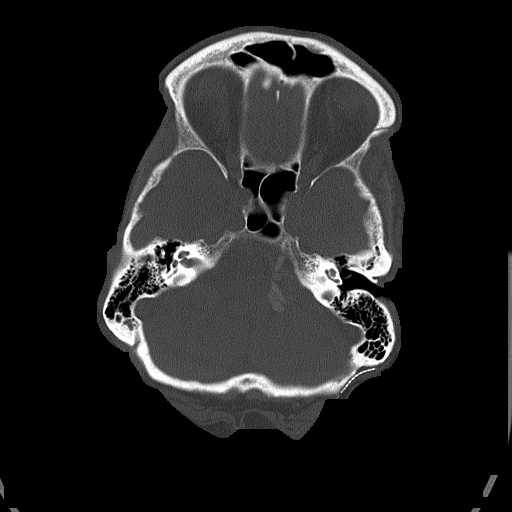

[Series 5: head without cor · coronal · non-contrast · 0.27mm/px · 3 of 67 slices shown]
[im 23/67  brain]
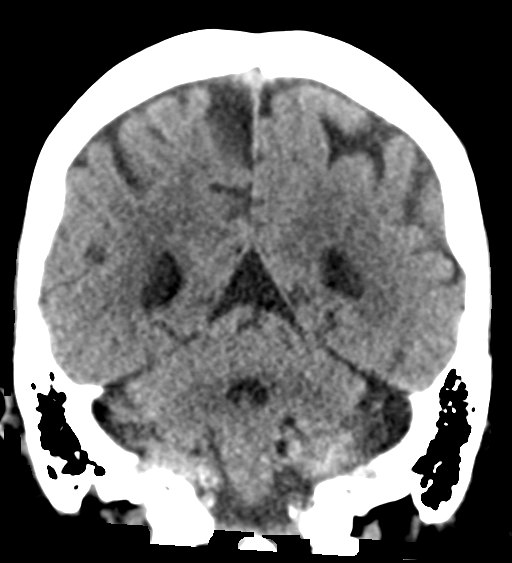
[im 30/67  brain]
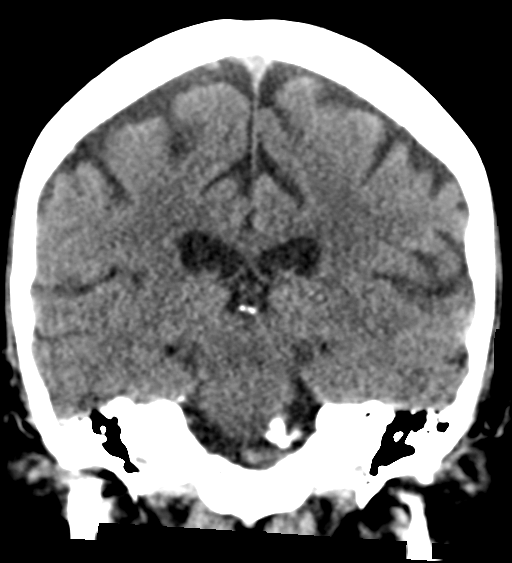
[im 37/67  brain]
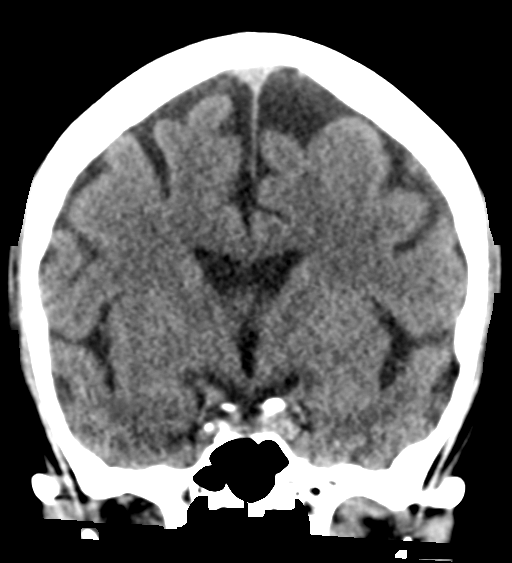

[Series 6: head without sag · sagittal · non-contrast · 0.30mm/px · 3 of 46 slices shown]
[im 16/46  brain]
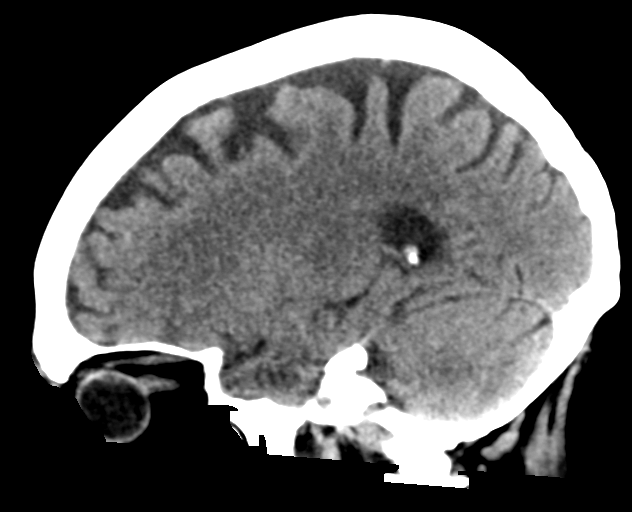
[im 23/46  brain]
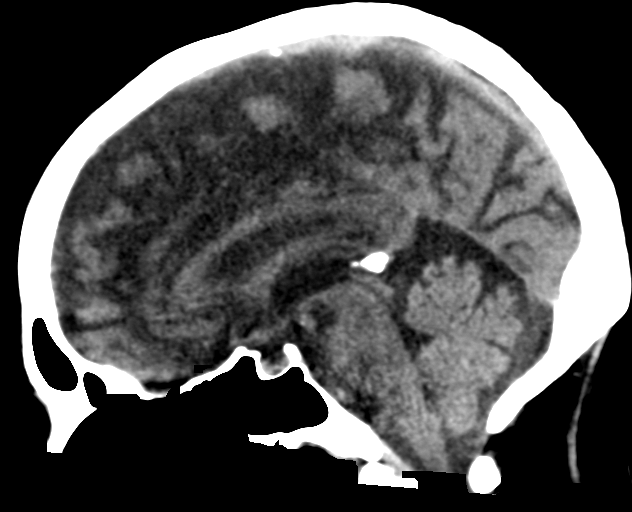
[im 31/46  brain]
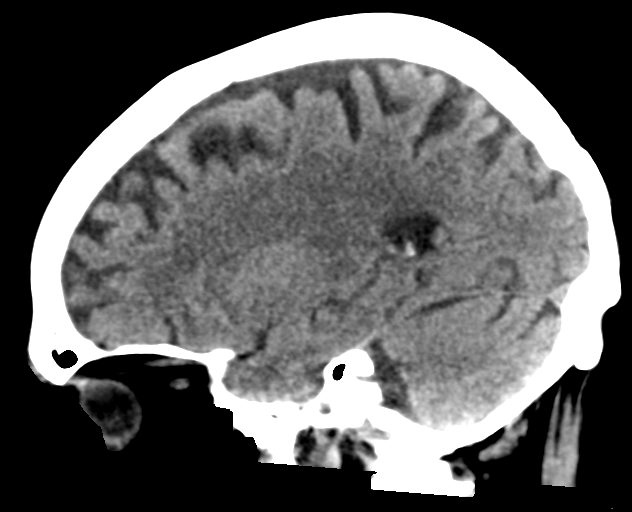

[15 of 47 positions shown; findings below may reference images not displayed]

FINDINGS: Brain: There is age related volume loss.

The patient has had a previous suboccipital craniotomy. There is a
calcified lesion located slightly medial to the left
cerebellopontine angle measuring 2.3 x 1.4 cm, stable. There is no
surrounding edema. There is no localized mass effect in this area.
No similar lesions are noted elsewhere.

No other evidence suggesting mass elsewhere. There is no hemorrhage.
No extra-axial fluid collection or midline shift. There is minimal
periventricular small vessel disease in the centra semiovale
bilaterally. No evident acute infarct.

Vascular: No hyperdense vessels are evident. There are foci of
calcification in each carotid siphon region. There is also
calcification in the distal left and right vertebral arteries.

Skull: Bony calvarium appears intact except for postoperative change
in the left inferolateral parietal bone. There is a benign
exostosis, stable, arising from the medial left occipital bone
measuring 1.1 x 0.7 cm.

Sinuses/Orbits: There is mucosal thickening in several ethmoid air
cells. Other visualized paranasal sinuses are clear. There is mild
leftward deviation of the nasal septum. Orbits appear symmetric
bilaterally.

Other: Mastoid air cells on the left are clear. There is
opacification in several inferior mastoid air cells on the right, a
finding present on prior MR.
IMPRESSION: 1. Calcified lesion along the medial aspect of the left
cerebellopontine angle, stable. No surrounding edema. Question
calcified meningioma versus postoperative scarring with
calcification. This area appears benign.

2. Age related volume loss with slight periventricular small vessel
disease. No intracranial hemorrhage or edema. No midline shift or
extra-axial fluid collection. No acute infarct.

3. Prior left occipital craniotomy. Benign exostosis medial left
occipital bone. No blastic or lytic appearing bone lesions.

4. Mucosal thickening in several ethmoid air cells. Mild nasal
septal deviation toward the left.

5.  Chronic right-sided mastoid air cell disease.

## 2018-11-27 IMAGING — DX DG CHEST 1V PORT
1 series · 1 of 1 positions shown · non-contrast
Comparison: 06/17/2016

CLINICAL DATA: Syncopal episodes.

EXAM:
PORTABLE CHEST 1 VIEW

[chest ap]
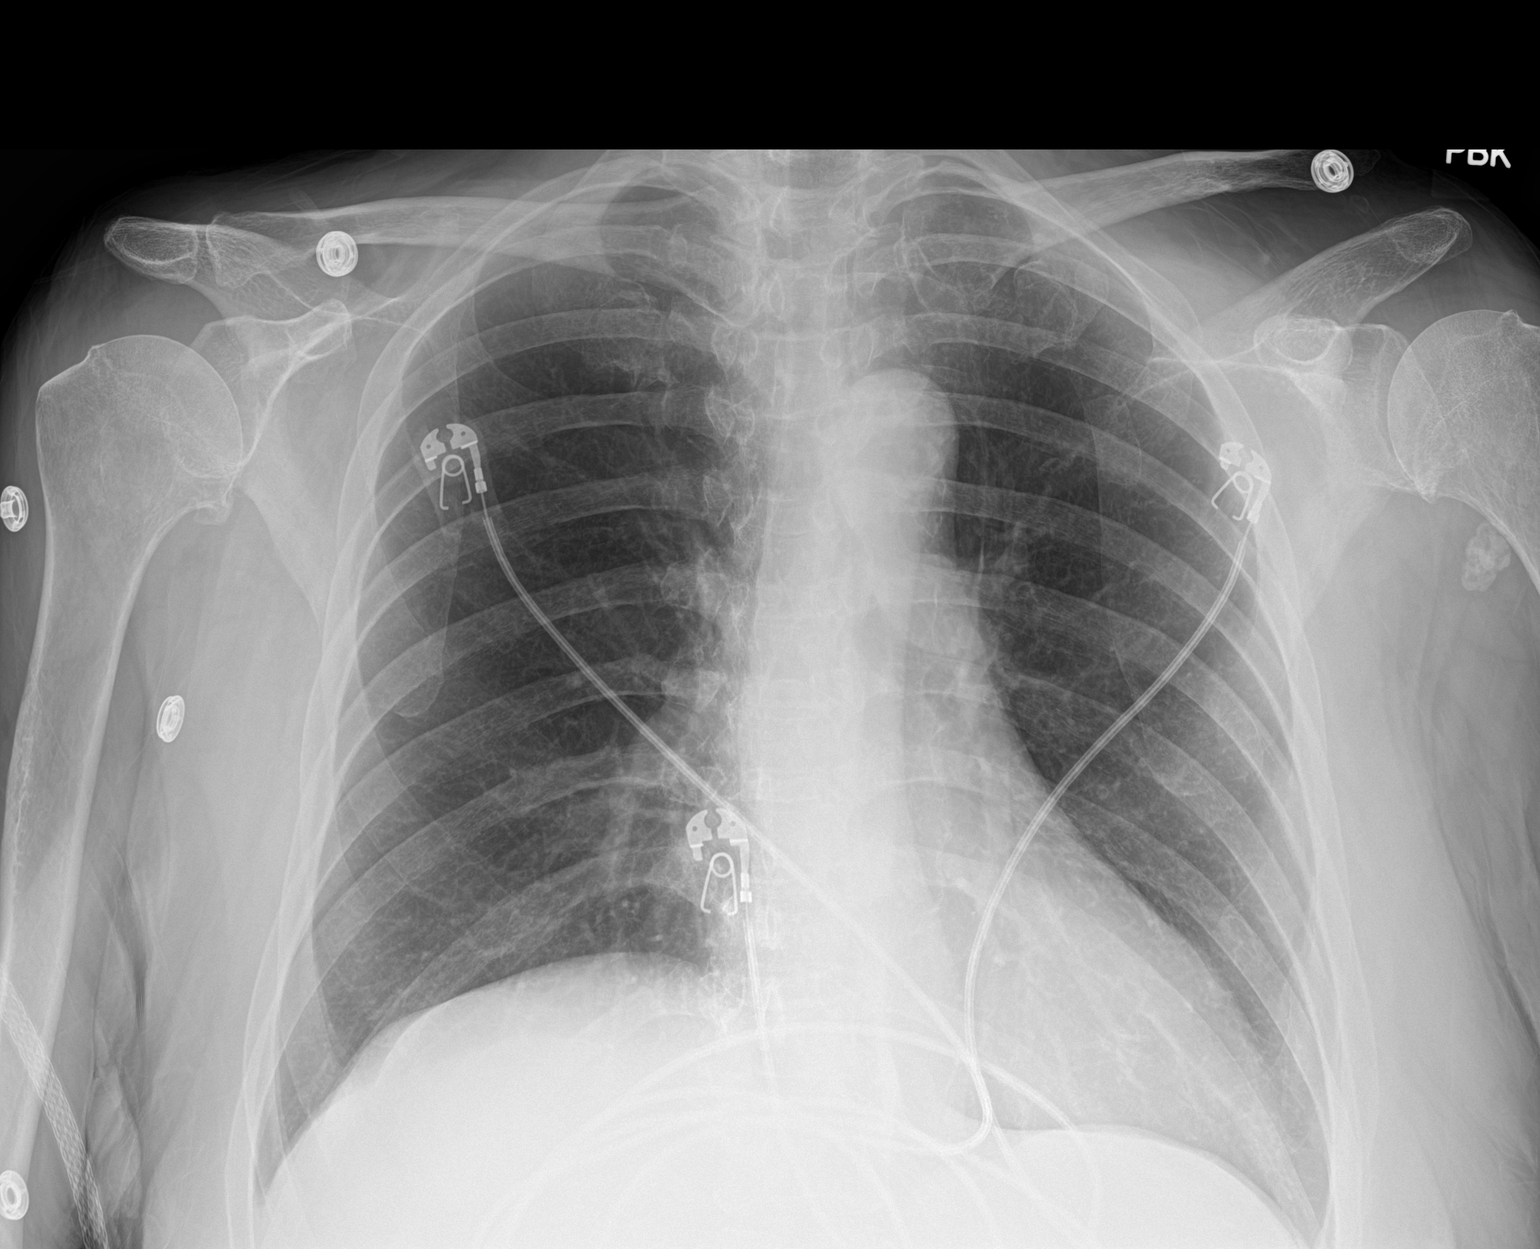

[1 of 1 positions shown; findings below may reference images not displayed]

FINDINGS: Heart size is normal. There is aortic atherosclerosis. The pulmonary
vascularity is normal. Lungs are clear. No effusions. No acute bone
findings. Chronic degenerative changes of the shoulders with loose
body on the left.
IMPRESSION: No active disease.
